# Patient Record
Sex: Female | Born: 1980 | Race: White | Hispanic: No | Marital: Married | Smoking: Never smoker
Health system: Southern US, Community
[De-identification: ages and names within clinical notes are randomized; demographics above are authoritative.]

## PROBLEM LIST (undated history)

## (undated) ENCOUNTER — Inpatient Hospital Stay (HOSPITAL_COMMUNITY): Payer: Self-pay

## (undated) DIAGNOSIS — F419 Anxiety disorder, unspecified: Secondary | ICD-10-CM

## (undated) DIAGNOSIS — B379 Candidiasis, unspecified: Secondary | ICD-10-CM

## (undated) DIAGNOSIS — R112 Nausea with vomiting, unspecified: Secondary | ICD-10-CM

## (undated) DIAGNOSIS — E079 Disorder of thyroid, unspecified: Secondary | ICD-10-CM

## (undated) DIAGNOSIS — R011 Cardiac murmur, unspecified: Secondary | ICD-10-CM

## (undated) DIAGNOSIS — R519 Headache, unspecified: Secondary | ICD-10-CM

## (undated) DIAGNOSIS — Z8619 Personal history of other infectious and parasitic diseases: Secondary | ICD-10-CM

## (undated) DIAGNOSIS — B019 Varicella without complication: Secondary | ICD-10-CM

## (undated) HISTORY — DX: Anxiety disorder, unspecified: F41.9

## (undated) HISTORY — PX: ABLATION: SHX5711

## (undated) HISTORY — DX: Candidiasis, unspecified: B37.9

## (undated) HISTORY — DX: Personal history of other infectious and parasitic diseases: Z86.19

## (undated) HISTORY — DX: Cardiac murmur, unspecified: R01.1

## (undated) HISTORY — DX: Disorder of thyroid, unspecified: E07.9

## (undated) HISTORY — PX: TONSILLECTOMY: SUR1361

## (undated) HISTORY — DX: Varicella without complication: B01.9

---

## 2006-02-04 ENCOUNTER — Encounter: Admission: RE | Admit: 2006-02-04 | Discharge: 2006-02-04 | Payer: Self-pay | Admitting: *Deleted

## 2006-02-23 ENCOUNTER — Encounter: Admission: RE | Admit: 2006-02-23 | Discharge: 2006-02-23 | Payer: Self-pay | Admitting: *Deleted

## 2006-03-03 ENCOUNTER — Encounter: Admission: RE | Admit: 2006-03-03 | Discharge: 2006-03-03 | Payer: Self-pay | Admitting: *Deleted

## 2006-12-01 ENCOUNTER — Other Ambulatory Visit: Admission: RE | Admit: 2006-12-01 | Discharge: 2006-12-01 | Payer: Self-pay | Admitting: Family Medicine

## 2007-09-21 IMAGING — US US SOFT TISSUE HEAD/NECK
1 series · 14 of 25 positions shown · non-contrast
Comparison: CT 02/04/2006

CLINICAL DATA: Right thyroid nodule seen on CT scan. Evaluate for possible
biopsy.

THYROID ULTRASOUND
TECHNIQUE: Ultrasound examination of the thyroid gland and adjacent soft tissue
structures was performed.

[Series 1: unknown · 0.07mm/px · 14 of 47 slices shown]
[im 1/47]
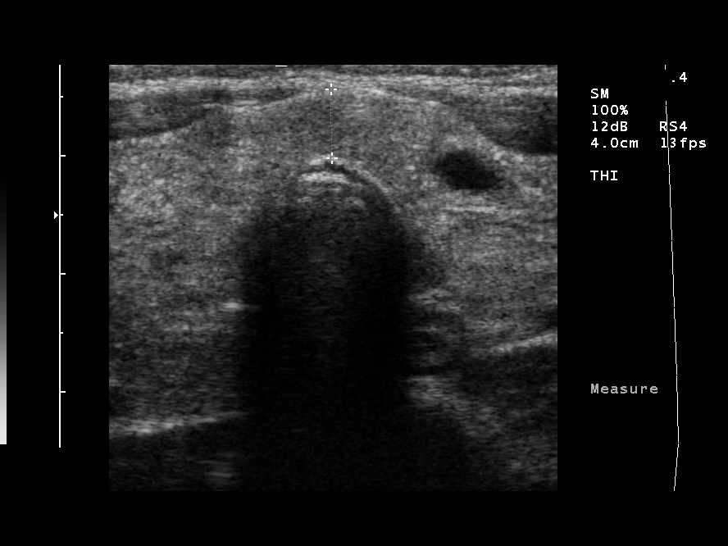
[im 4/47]
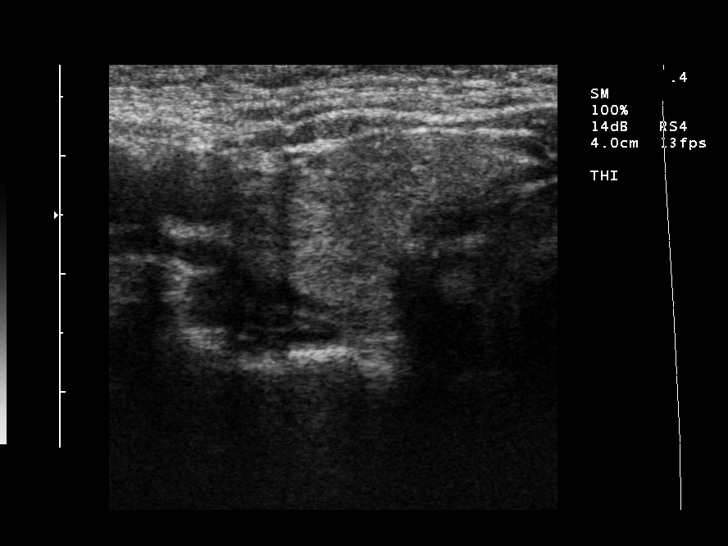
[im 8/47]
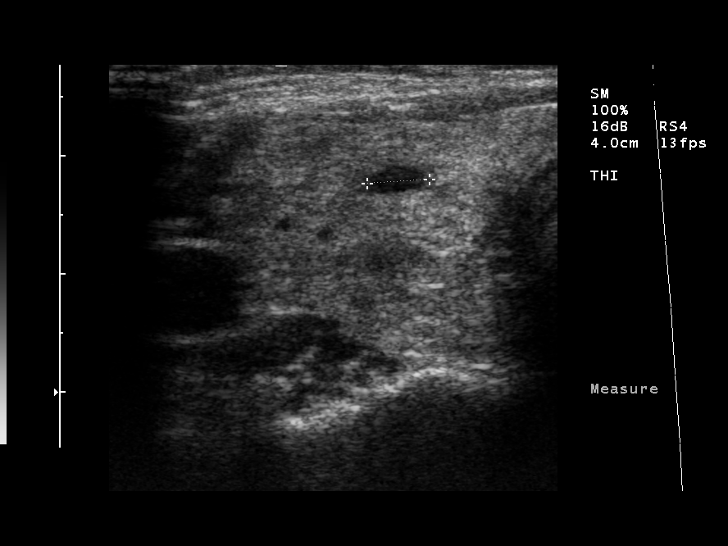
[im 12/47]
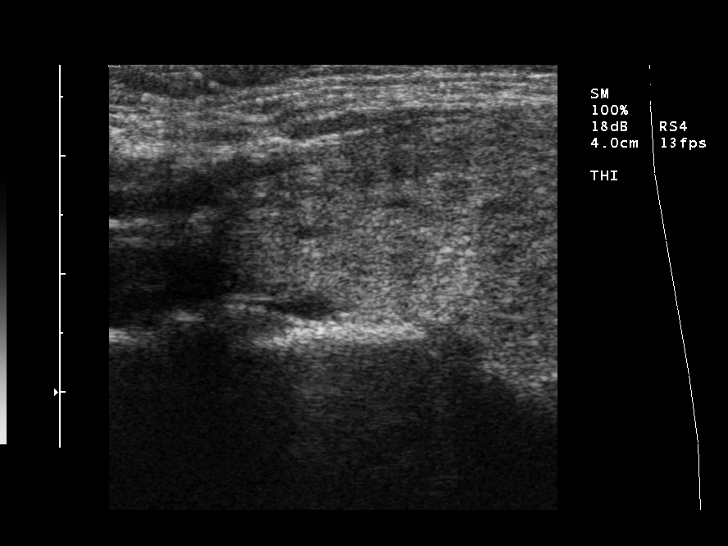
[im 16/47]
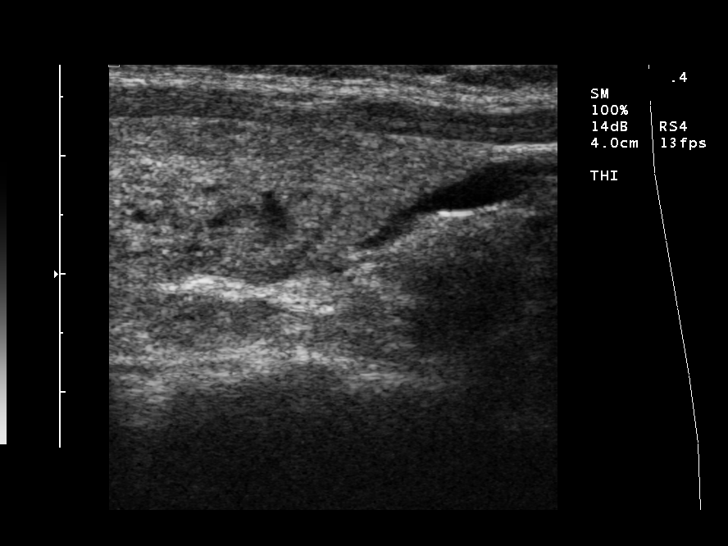
[im 18/47]
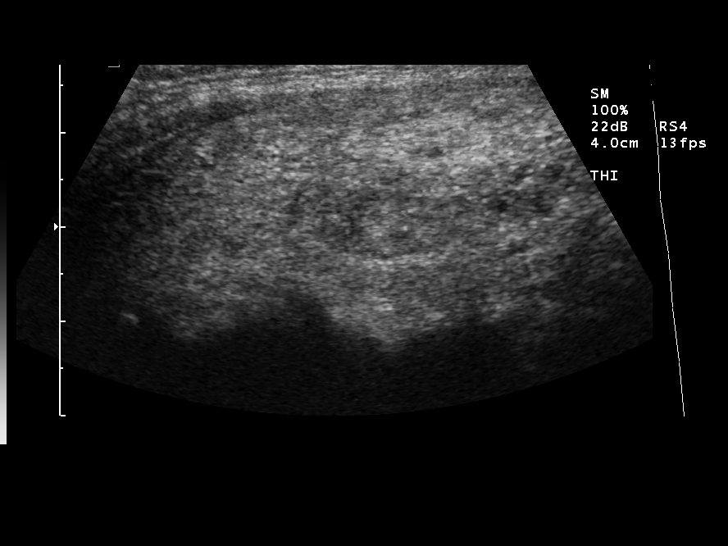
[im 22/47]
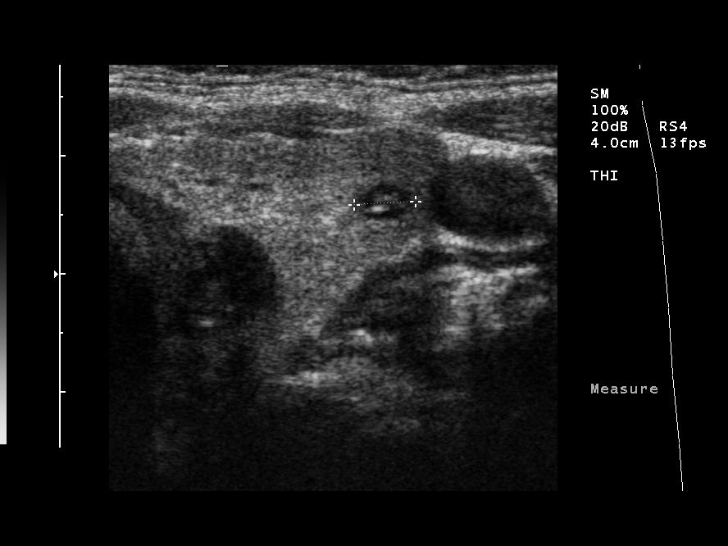
[im 25/47]
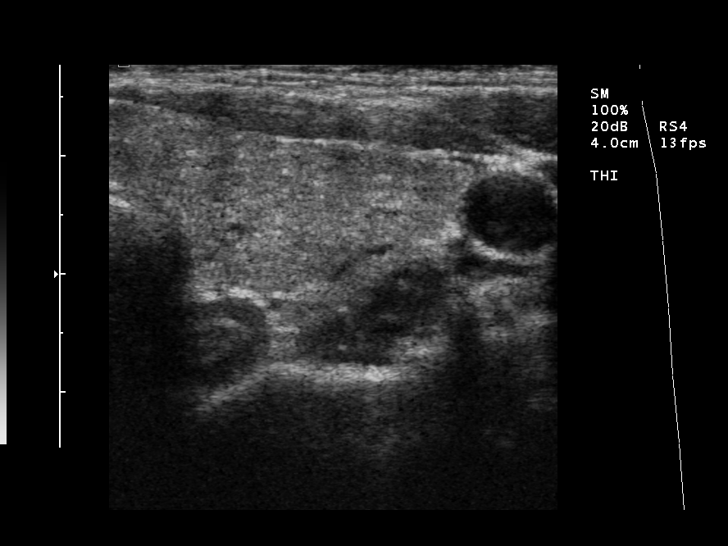
[im 29/47]
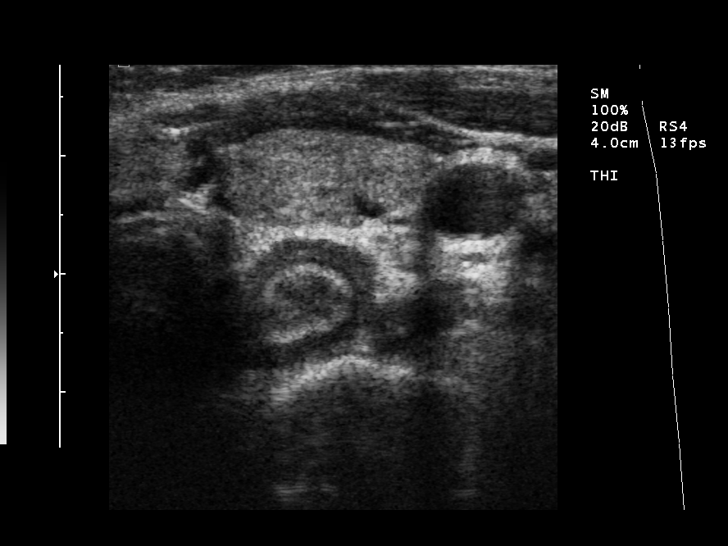
[im 31/47]
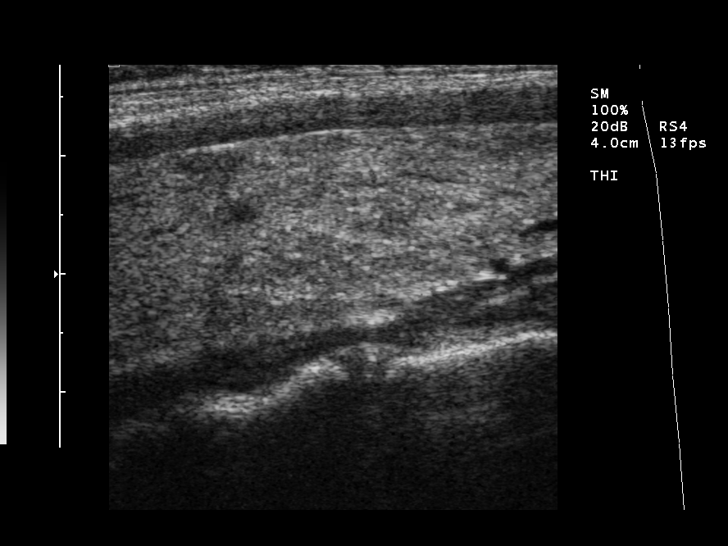
[im 35/47]
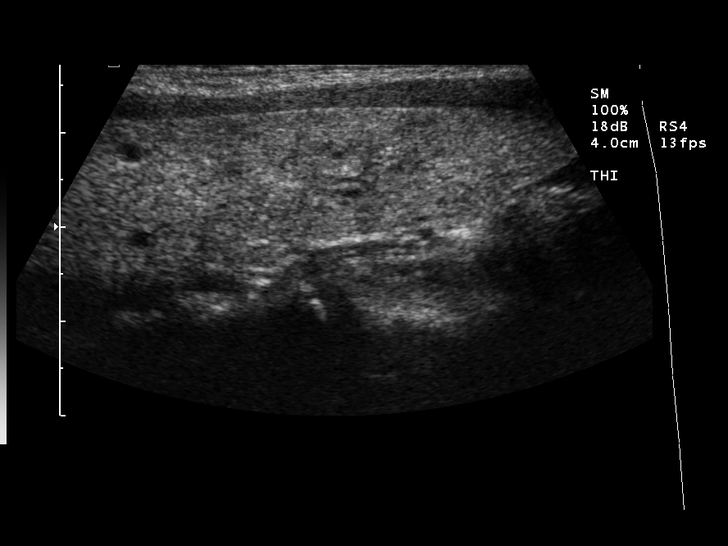
[im 39/47]
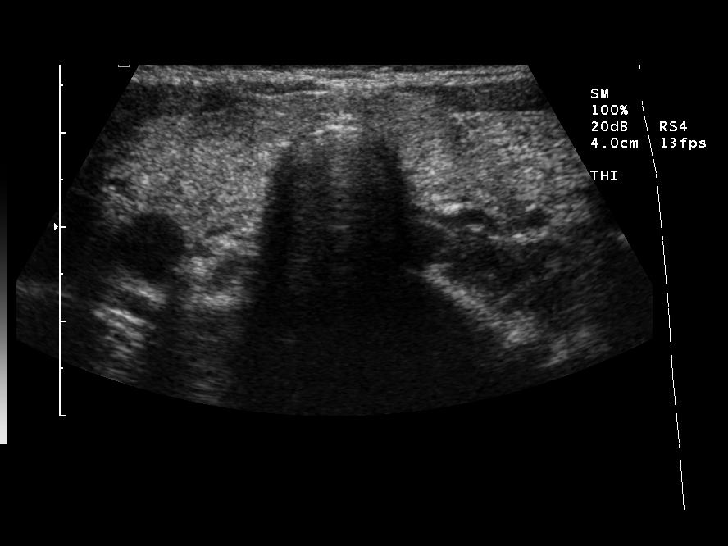
[im 43/47]
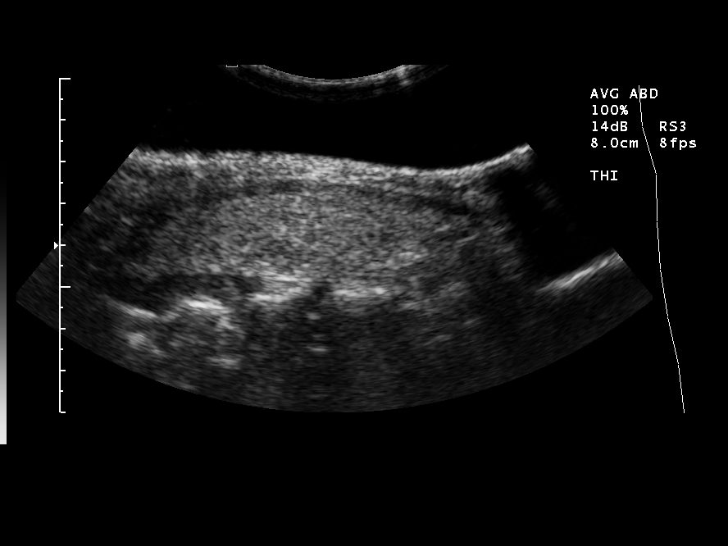
[im 47/47]
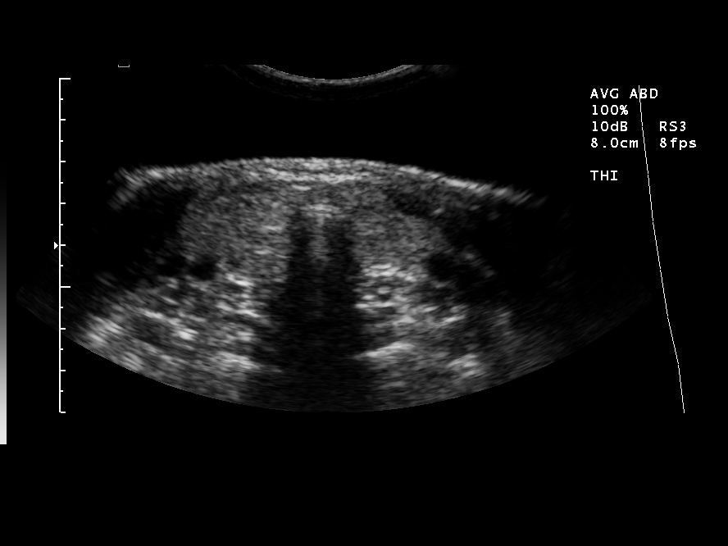

[14 of 25 positions shown; findings below may reference images not displayed]

FINDINGS: The right lobe of the thyroid measures 7.9 x 2.1 x 2.6 cm. Left
thyroid lobe measures 7.8 x 1.9 x 2.5 cm. Isthmus is 5-6 mm.

Thyroid echotexture is an homogeneous. There are several small hypoechoic
nodules seen within the thyroid lobes bilaterally. On the right, these are less
than 9 mm, and on the left, these are less than 8 mm.

Posteriorly in the right thyroid lobe, in the area questioned by CT scan, there
is no exophytic nodule seen. No focal lesions seen posteriorly in the right
thyroid lobe. No nodule over 1 cm seen at this time that warrants biopsy.

IMPRESSION

There is no ultrasound abnormality posteriorly within the right thyroid lobe
corresponding to the suspected nodule seen by CT scan.

There are several small hypoechoic nodules bilaterally, all less than 1 cm in
size, with no dominant nodule. Therefore no thyroid biopsy was performed.

Recommend followup ultrasound in 6-12 months.

## 2007-12-09 ENCOUNTER — Other Ambulatory Visit: Admission: RE | Admit: 2007-12-09 | Discharge: 2007-12-09 | Payer: Self-pay | Admitting: Family Medicine

## 2008-03-17 HISTORY — PX: WISDOM TOOTH EXTRACTION: SHX21

## 2008-12-27 ENCOUNTER — Other Ambulatory Visit: Admission: RE | Admit: 2008-12-27 | Discharge: 2008-12-27 | Payer: Self-pay | Admitting: Family Medicine

## 2010-01-25 ENCOUNTER — Other Ambulatory Visit: Admission: RE | Admit: 2010-01-25 | Discharge: 2010-01-25 | Payer: Self-pay | Admitting: Family Medicine

## 2011-03-18 NOTE — L&D Delivery Note (Signed)
Delivery Note At 11:34 AM a viable female, "Kaitlyn Todd", was delivered via Vaginal, Spontaneous Delivery (Presentation: Left Occiput Anterior).  APGAR: 9, 9; weight .   Placenta status: Intact, Spontaneous.  Cord: 3 vessels with the following complications: None.  Cord pH: NA Had 2 gushes of moderate bleeding just after placenta delivered--uterus slightly boggy. Cytotech 1000 mcg placed in rectum for prophylaxis.  Fundus firm after that. Vaginal vault friable, but hemostatic with pressure.  Anesthesia: Epidural  Episiotomy: None Lacerations: 2nd degree;Perineal Suture Repair: 3.0 and 4-0 monocryl Est. Blood Loss (mL): 400 cc   Mom to postpartum.  Baby to skin to skin.   Nigel Bridgeman 12/12/2011, 12:33 PM

## 2011-04-28 DIAGNOSIS — Z91040 Latex allergy status: Secondary | ICD-10-CM | POA: Insufficient documentation

## 2011-04-28 DIAGNOSIS — E049 Nontoxic goiter, unspecified: Secondary | ICD-10-CM | POA: Insufficient documentation

## 2011-04-28 DIAGNOSIS — F419 Anxiety disorder, unspecified: Secondary | ICD-10-CM | POA: Insufficient documentation

## 2011-04-28 LAB — OB RESULTS CONSOLE PLATELET COUNT: Platelets: 318 10*3/uL

## 2011-04-28 LAB — OB RESULTS CONSOLE ANTIBODY SCREEN: Antibody Screen: NEGATIVE

## 2011-04-28 LAB — OB RESULTS CONSOLE RPR: RPR: NONREACTIVE

## 2011-04-28 LAB — OB RESULTS CONSOLE ABO/RH: RH Type: POSITIVE

## 2011-04-28 LAB — OB RESULTS CONSOLE HGB/HCT, BLOOD
HCT: 40 %
Hemoglobin: 13.7 g/dL

## 2011-04-28 LAB — OB RESULTS CONSOLE TSH: TSH: 0.275

## 2011-04-28 LAB — OB RESULTS CONSOLE RUBELLA ANTIBODY, IGM: Rubella: IMMUNE

## 2011-04-28 LAB — OB RESULTS CONSOLE HEPATITIS B SURFACE ANTIGEN: Hepatitis B Surface Ag: NEGATIVE

## 2011-04-28 LAB — OB RESULTS CONSOLE HIV ANTIBODY (ROUTINE TESTING): HIV: NONREACTIVE

## 2011-05-13 ENCOUNTER — Encounter (INDEPENDENT_AMBULATORY_CARE_PROVIDER_SITE_OTHER): Payer: BC Managed Care – PPO

## 2011-05-13 ENCOUNTER — Encounter (INDEPENDENT_AMBULATORY_CARE_PROVIDER_SITE_OTHER): Payer: BC Managed Care – PPO | Admitting: Obstetrics and Gynecology

## 2011-05-13 DIAGNOSIS — N898 Other specified noninflammatory disorders of vagina: Secondary | ICD-10-CM

## 2011-05-13 DIAGNOSIS — Z01419 Encounter for gynecological examination (general) (routine) without abnormal findings: Secondary | ICD-10-CM

## 2011-05-13 DIAGNOSIS — O26849 Uterine size-date discrepancy, unspecified trimester: Secondary | ICD-10-CM

## 2011-05-13 DIAGNOSIS — Z331 Pregnant state, incidental: Secondary | ICD-10-CM

## 2011-05-13 LAB — OB RESULTS CONSOLE GC/CHLAMYDIA
Chlamydia: NEGATIVE
Gonorrhea: NEGATIVE

## 2011-06-16 ENCOUNTER — Encounter (INDEPENDENT_AMBULATORY_CARE_PROVIDER_SITE_OTHER): Payer: BC Managed Care – PPO | Admitting: Registered Nurse

## 2011-06-16 DIAGNOSIS — Z3480 Encounter for supervision of other normal pregnancy, unspecified trimester: Secondary | ICD-10-CM

## 2011-07-07 ENCOUNTER — Other Ambulatory Visit: Payer: Self-pay

## 2011-07-07 DIAGNOSIS — Z3689 Encounter for other specified antenatal screening: Secondary | ICD-10-CM

## 2011-07-08 DIAGNOSIS — Z91040 Latex allergy status: Secondary | ICD-10-CM

## 2011-07-08 DIAGNOSIS — F419 Anxiety disorder, unspecified: Secondary | ICD-10-CM

## 2011-07-08 DIAGNOSIS — E049 Nontoxic goiter, unspecified: Secondary | ICD-10-CM

## 2011-07-09 ENCOUNTER — Other Ambulatory Visit: Payer: BC Managed Care – PPO

## 2011-07-09 ENCOUNTER — Ambulatory Visit (INDEPENDENT_AMBULATORY_CARE_PROVIDER_SITE_OTHER): Payer: BC Managed Care – PPO

## 2011-07-09 ENCOUNTER — Encounter: Payer: Self-pay | Admitting: Obstetrics and Gynecology

## 2011-07-09 ENCOUNTER — Other Ambulatory Visit: Payer: Self-pay | Admitting: Obstetrics and Gynecology

## 2011-07-09 ENCOUNTER — Ambulatory Visit (INDEPENDENT_AMBULATORY_CARE_PROVIDER_SITE_OTHER): Payer: BC Managed Care – PPO | Admitting: Obstetrics and Gynecology

## 2011-07-09 VITALS — BP 120/62 | Ht 66.0 in | Wt 153.0 lb

## 2011-07-09 DIAGNOSIS — Z3689 Encounter for other specified antenatal screening: Secondary | ICD-10-CM

## 2011-07-09 DIAGNOSIS — Z3400 Encounter for supervision of normal first pregnancy, unspecified trimester: Secondary | ICD-10-CM

## 2011-07-09 LAB — US OB COMP + 14 WK

## 2011-07-09 NOTE — Progress Notes (Signed)
No complaints Doing Well Saw Dr Sharl Ma 3/1 had blood work on 4/1 Will get note U/s Reviewed Nl but limited anatomy will sched f/u at [facility] Ultrasound shows:  SIUP  S=D     [place] EDD: c/w dates            AFI: normal           Cervical length: not done           Placenta localization: anterior           Fetal presentation: breech                    Anatomy survey is normal limited           Gender : DNWTK

## 2011-08-06 ENCOUNTER — Encounter: Payer: Self-pay | Admitting: Obstetrics and Gynecology

## 2011-08-06 ENCOUNTER — Ambulatory Visit (INDEPENDENT_AMBULATORY_CARE_PROVIDER_SITE_OTHER): Payer: BC Managed Care – PPO | Admitting: Obstetrics and Gynecology

## 2011-08-06 ENCOUNTER — Ambulatory Visit (INDEPENDENT_AMBULATORY_CARE_PROVIDER_SITE_OTHER): Payer: BC Managed Care – PPO

## 2011-08-06 VITALS — BP 104/66 | Wt 160.0 lb

## 2011-08-06 DIAGNOSIS — Z3400 Encounter for supervision of normal first pregnancy, unspecified trimester: Secondary | ICD-10-CM

## 2011-08-06 DIAGNOSIS — Z331 Pregnant state, incidental: Secondary | ICD-10-CM

## 2011-08-06 DIAGNOSIS — O358XX0 Maternal care for other (suspected) fetal abnormality and damage, not applicable or unspecified: Secondary | ICD-10-CM

## 2011-08-06 LAB — US OB FOLLOW UP

## 2011-08-06 NOTE — Progress Notes (Addendum)
Patient ID: Kaitlyn Todd, female   DOB: 11-21-1980, 31 y.o.   MRN: [id number] anterior placenta US anatomy complete WNL cervix 3.3 Plan f/o 4 weeks 1 gtt, rpr, hgb discussed, Reviewed s/s preterm labor, srom, vag bleeding, kick counts to report, enc 8 water daily and frequent voids Lavera Guise, CNM

## 2011-09-03 ENCOUNTER — Other Ambulatory Visit: Payer: BC Managed Care – PPO

## 2011-09-03 ENCOUNTER — Ambulatory Visit (INDEPENDENT_AMBULATORY_CARE_PROVIDER_SITE_OTHER): Payer: BC Managed Care – PPO | Admitting: Obstetrics and Gynecology

## 2011-09-03 ENCOUNTER — Encounter: Payer: Self-pay | Admitting: Obstetrics and Gynecology

## 2011-09-03 VITALS — BP 106/62 | Wt 165.0 lb

## 2011-09-03 DIAGNOSIS — Z331 Pregnant state, incidental: Secondary | ICD-10-CM

## 2011-09-03 NOTE — Progress Notes (Signed)
Patient ID: Kaitlyn Todd, female   DOB: Sep 27, 1980, 31 y.o.   MRN: [id number] Reviewed s/s preterm labor, srom, vag bleeding, kick counts to report, enc 8 water daily and frequent . Some wheezing in pool last week, no hx of asthma just allergies no issues since f/o with PCP in this regard. Lavera Guise, CNM

## 2011-09-03 NOTE — Progress Notes (Signed)
Pt states she has no concerns today. 1 GTT given today.

## 2011-09-04 LAB — HEMOGLOBIN: Hemoglobin: 12.8 g/dL (ref 12.0–15.0)

## 2011-09-04 LAB — GLUCOSE TOLERANCE, 1 HOUR: Glucose, 1 Hour GTT: 110 mg/dL (ref 70–140)

## 2011-09-04 LAB — RPR

## 2011-09-15 ENCOUNTER — Ambulatory Visit (INDEPENDENT_AMBULATORY_CARE_PROVIDER_SITE_OTHER): Payer: BC Managed Care – PPO | Admitting: Obstetrics and Gynecology

## 2011-09-15 VITALS — BP 112/68 | Wt 168.0 lb

## 2011-09-15 DIAGNOSIS — Z331 Pregnant state, incidental: Secondary | ICD-10-CM

## 2011-09-15 NOTE — Progress Notes (Signed)
Glucola normal Diet reviewed

## 2011-09-29 ENCOUNTER — Encounter: Payer: Self-pay | Admitting: Obstetrics and Gynecology

## 2011-09-29 ENCOUNTER — Ambulatory Visit (INDEPENDENT_AMBULATORY_CARE_PROVIDER_SITE_OTHER): Payer: BC Managed Care – PPO | Admitting: Obstetrics and Gynecology

## 2011-09-29 VITALS — BP 110/66 | Wt 166.5 lb

## 2011-09-29 DIAGNOSIS — Z3490 Encounter for supervision of normal pregnancy, unspecified, unspecified trimester: Secondary | ICD-10-CM

## 2011-09-29 DIAGNOSIS — Z331 Pregnant state, incidental: Secondary | ICD-10-CM

## 2011-09-29 NOTE — Progress Notes (Signed)
Results for orders placed in visit on 09/03/11  GLUCOSE TOLERANCE, 1 HOUR      Component Value Range   Glucose, 1 Hour GTT 110  70 - 140 mg/dL  HEMOGLOBIN      Component Value Range   Hemoglobin 12.8  12.0 - 15.0 g/dL  RPR      Component Value Range   RPR NON REAC  NON REAC   No complaints FKCs Doing well RTO 2wks H/o goiter - will try to get note from Dr. Sharl Ma from visit in March and if not recently tested would rec TSH and free T4 at [facility]

## 2011-10-14 ENCOUNTER — Ambulatory Visit (INDEPENDENT_AMBULATORY_CARE_PROVIDER_SITE_OTHER): Payer: BC Managed Care – PPO | Admitting: Obstetrics and Gynecology

## 2011-10-14 ENCOUNTER — Encounter: Payer: Self-pay | Admitting: Obstetrics and Gynecology

## 2011-10-14 VITALS — BP 100/60 | Wt 173.0 lb

## 2011-10-14 DIAGNOSIS — Z331 Pregnant state, incidental: Secondary | ICD-10-CM

## 2011-10-14 NOTE — Progress Notes (Signed)
Pt states bp was 90/40 on Sunday and was experiencing dizziness.

## 2011-10-14 NOTE — Progress Notes (Signed)
Chest: Clear.  Heart: Regular rate and rhythm.  Ears: Clear. Dizziness discussed. Return office in 2 weeks. Dr. Stefano Gaul

## 2011-10-28 ENCOUNTER — Ambulatory Visit (INDEPENDENT_AMBULATORY_CARE_PROVIDER_SITE_OTHER): Payer: BC Managed Care – PPO | Admitting: Obstetrics and Gynecology

## 2011-10-28 VITALS — BP 110/72 | Wt 174.0 lb

## 2011-10-28 DIAGNOSIS — Z331 Pregnant state, incidental: Secondary | ICD-10-CM

## 2011-10-28 NOTE — Progress Notes (Signed)
Pt. Stated no issues today.  

## 2011-10-28 NOTE — Progress Notes (Signed)
Doing well.  Dizziness has improved.  Some trouble sleeping. Baby is very active. Form completed for work. Return to office in 2 weeks. Dr. Stefano Gaul

## 2011-11-10 ENCOUNTER — Encounter: Payer: Self-pay | Admitting: Obstetrics and Gynecology

## 2011-11-10 ENCOUNTER — Ambulatory Visit (INDEPENDENT_AMBULATORY_CARE_PROVIDER_SITE_OTHER): Payer: BC Managed Care – PPO | Admitting: Obstetrics and Gynecology

## 2011-11-10 VITALS — BP 120/60 | Wt 179.0 lb

## 2011-11-10 DIAGNOSIS — Z331 Pregnant state, incidental: Secondary | ICD-10-CM

## 2011-11-10 LAB — OB RESULTS CONSOLE GBS: GBS: NEGATIVE

## 2011-11-10 NOTE — Progress Notes (Signed)
Pt states no concerns today.   

## 2011-11-10 NOTE — Progress Notes (Signed)
GBS today

## 2011-11-10 NOTE — Progress Notes (Signed)
[redacted]w[redacted]d GBS today Labor signs reviewed

## 2011-11-13 LAB — CULTURE, BETA STREP (GROUP B ONLY)

## 2011-11-18 ENCOUNTER — Ambulatory Visit (INDEPENDENT_AMBULATORY_CARE_PROVIDER_SITE_OTHER): Payer: BC Managed Care – PPO | Admitting: Obstetrics and Gynecology

## 2011-11-18 ENCOUNTER — Encounter: Payer: Self-pay | Admitting: Obstetrics and Gynecology

## 2011-11-18 VITALS — BP 102/60 | Wt 179.0 lb

## 2011-11-18 DIAGNOSIS — Z331 Pregnant state, incidental: Secondary | ICD-10-CM

## 2011-11-18 NOTE — Progress Notes (Signed)
[redacted]w[redacted]d GBS negative Sleep issues: suggest Unisom

## 2011-11-18 NOTE — Progress Notes (Signed)
[redacted]w[redacted]d  Pt c/o sharp pelvic pains.

## 2011-11-25 ENCOUNTER — Ambulatory Visit (INDEPENDENT_AMBULATORY_CARE_PROVIDER_SITE_OTHER): Payer: BC Managed Care – PPO | Admitting: Obstetrics and Gynecology

## 2011-11-25 ENCOUNTER — Encounter: Payer: Self-pay | Admitting: Obstetrics and Gynecology

## 2011-11-25 VITALS — BP 102/70 | Wt 178.5 lb

## 2011-11-25 DIAGNOSIS — Z331 Pregnant state, incidental: Secondary | ICD-10-CM

## 2011-11-25 DIAGNOSIS — Z3490 Encounter for supervision of normal pregnancy, unspecified, unspecified trimester: Secondary | ICD-10-CM

## 2011-11-25 NOTE — Progress Notes (Signed)
No concerns per pt  Pt questioned is it best to get flu shot before or after delivery? Informed pt before delivery is recommended that way the baby is covered as well. Also informed pt flu shot not available in this office yet. Pt states she will get it at a flu vaccine fair.

## 2011-11-25 NOTE — Progress Notes (Signed)
Doing well, but ready. Note for work, informing patient may need additional days off prior to taking full maternity leave, due to pregnancy issues. Reviewed negative GBS. Attended [employer] preparation classes--hopes for unmedicated birth, but flexible.

## 2011-11-26 ENCOUNTER — Encounter: Payer: Self-pay | Admitting: Obstetrics and Gynecology

## 2011-11-26 ENCOUNTER — Other Ambulatory Visit: Payer: Self-pay | Admitting: Obstetrics and Gynecology

## 2011-11-27 ENCOUNTER — Inpatient Hospital Stay (HOSPITAL_COMMUNITY)
Admission: AD | Admit: 2011-11-27 | Discharge: 2011-11-27 | Disposition: A | Discharge status: Home / Self Care | Payer: BC Managed Care – PPO | Source: Ambulatory Visit | Attending: Obstetrics and Gynecology | Admitting: Obstetrics and Gynecology

## 2011-11-27 ENCOUNTER — Encounter (HOSPITAL_COMMUNITY): Payer: Self-pay | Admitting: *Deleted

## 2011-11-27 DIAGNOSIS — O368190 Decreased fetal movements, unspecified trimester, not applicable or unspecified: Secondary | ICD-10-CM | POA: Insufficient documentation

## 2011-11-27 NOTE — MAU Provider Note (Signed)
  History   C/o decreased Fetal movement this morning at [redacted]w[redacted]d Advised to come to the hospital for NST  CSN: [id number]  Arrival date and time: 11/27/11 4098   First Provider Initiated Contact with Patient 11/27/11 1191      Chief Complaint  Patient presents with  . Decreased Fetal Movement   HPI  OB History    Grav Para Term Preterm Abortions TAB SAB Ect Mult Living   1               Past Medical History  Diagnosis Date  . Varicella   . H/O varicella   . H/O candidiasis   . Heart murmur   . Anxiety   . Thyroid dysfunction   . Yeast infection     Past Surgical History  Procedure Date  . Tonsillectomy     age 31  . Wisdom tooth extraction 2010    Family History  Problem Relation Age of Onset  . Hypertension Mother   . Breast cancer Mother   . Anxiety disorder Mother   . Depression Father   . Breast cancer Maternal Aunt   . Depression Maternal Uncle   . Alzheimer's disease Maternal Grandmother   . Heart disease Maternal Grandfather   . Cancer Paternal Grandmother   . Parkinsonism Paternal Grandfather     History  Substance Use Topics  . Smoking status: Never Smoker   . Smokeless tobacco: Never Used  . Alcohol Use: No    Allergies:  Allergies  Allergen Reactions  . Latex Other (See Comments)    sensitivity    Prescriptions prior to admission  Medication Sig Dispense Refill  . Prenatal Vit-Fe Fumarate-FA (PRENATAL MULTIVITAMIN) TABS Take 1 tablet by mouth daily.        Review of Systems  Constitutional: Negative.   HENT: Negative.   Eyes: Negative.   Respiratory: Negative.   Cardiovascular: Negative.   Gastrointestinal: Negative.   Genitourinary: Negative.   Musculoskeletal: Negative.   Skin: Negative.   Neurological: Negative.   Endo/Heme/Allergies: Negative.   Psychiatric/Behavioral: Negative.    Physical Exam   Blood pressure 115/74, pulse 84, temperature 98 F (36.7 C), temperature source Oral, resp. rate 18, height 5\' 7"  (1.702  m), weight 177 lb 9.6 oz (80.559 kg), last menstrual period 03/04/2011.  Physical Exam  Constitutional: She is oriented to person, place, and time. She appears well-developed and well-nourished.  HENT:  Head: Normocephalic and atraumatic.  Eyes: Pupils are equal, round, and reactive to light.  Neck: Normal range of motion. Neck supple.  Cardiovascular: Normal rate, regular rhythm and normal heart sounds.   Respiratory: Effort normal and breath sounds normal.  GI: Soft. Bowel sounds are normal.  Genitourinary: Vagina normal.       SvE: 0.5/90%/-2, soft and favorable. Regular uterine contractions 1: 3 - 4 mins  Musculoskeletal: Normal range of motion.  Neurological: She is alert and oriented to person, place, and time. She has normal reflexes.  Skin: Skin is warm and dry.  Psychiatric: She has a normal mood and affect.    MAU Course  Procedures  NST: Accels and FM++ , Cat 1,  SVE  Assessment and Plan  Fetal assessment reassuring. Patient had SVE: 0.5/90%/-2, intact. Labor precautions given.   Sopheap Boehle, CNM. 11/27/2011, 9:11 AM

## 2011-11-27 NOTE — MAU Note (Signed)
Pt noticed decreased FM since 0630 this a.m.,  Denies uc's, bleeding or LOF.

## 2011-11-27 NOTE — MAU Note (Signed)
Patient states she has not been feeling as much fetal movement as usual this morning. Denies any contractions.

## 2011-12-02 ENCOUNTER — Ambulatory Visit (INDEPENDENT_AMBULATORY_CARE_PROVIDER_SITE_OTHER): Payer: BC Managed Care – PPO | Admitting: Obstetrics and Gynecology

## 2011-12-02 VITALS — BP 118/62 | Wt 180.0 lb

## 2011-12-02 DIAGNOSIS — Z331 Pregnant state, incidental: Secondary | ICD-10-CM

## 2011-12-02 NOTE — Patient Instructions (Signed)

## 2011-12-02 NOTE — Progress Notes (Signed)
[redacted]w[redacted]d Doing well, GFM Was seen at MAU for dec FM, NST reactive Pt was upset Sunday that baby hadn't come yet, feels better now Took bradley classes Enc relaxation and stretching Has pelvic pressure but not many ctx cx 1cm/50/-2, anterior, soft, but not stretchy  rv'd PD mgmnt rv'd FKC and labor sx's RTO 1 wk

## 2011-12-02 NOTE — Progress Notes (Signed)
[redacted]w[redacted]d  Pt desires cervix check today.

## 2011-12-08 ENCOUNTER — Ambulatory Visit (INDEPENDENT_AMBULATORY_CARE_PROVIDER_SITE_OTHER): Payer: BC Managed Care – PPO | Admitting: Obstetrics and Gynecology

## 2011-12-08 ENCOUNTER — Telehealth: Payer: Self-pay | Admitting: Obstetrics and Gynecology

## 2011-12-08 VITALS — BP 110/60 | Wt 183.0 lb

## 2011-12-08 DIAGNOSIS — Z0371 Encounter for suspected problem with amniotic cavity and membrane ruled out: Secondary | ICD-10-CM

## 2011-12-08 DIAGNOSIS — Z331 Pregnant state, incidental: Secondary | ICD-10-CM

## 2011-12-08 NOTE — Patient Instructions (Signed)

## 2011-12-08 NOTE — Progress Notes (Signed)
[redacted]w[redacted]d Pt presents today with watery leakage on last pm and twice this am. Pt had previously complained of decrease fetal movement but stated that the baby is moving more now after she had a meal. Mathis Bud

## 2011-12-08 NOTE — Telephone Encounter (Signed)
TC from pt.   States has had 3 small gushes of fluid this AM. Underwear are damp. No contractions. Unsure if decreased FM.  Per SL pt to office for eval.

## 2011-12-08 NOTE — Progress Notes (Signed)
[redacted]w[redacted]d Had sm amt leaking last night and several more times this morning, saturates underwear, but not pad GFM Rare ctx Sterile spec - neg pooling, neg fern, neg nitrazine Cervical mucous noted Cx=loose 1/50/-2  Ready!  rv'd labor sx's and FKC RTO 1 wk, will do NST rv'd PD mgmnt , pt would like to avoid IOL, but may be open to it

## 2011-12-10 ENCOUNTER — Encounter: Payer: BC Managed Care – PPO | Admitting: Obstetrics and Gynecology

## 2011-12-11 ENCOUNTER — Encounter (HOSPITAL_COMMUNITY): Payer: Self-pay | Admitting: *Deleted

## 2011-12-11 ENCOUNTER — Inpatient Hospital Stay (HOSPITAL_COMMUNITY)
Admission: AD | Admit: 2011-12-11 | Discharge: 2011-12-14 | DRG: 373 | Disposition: A | Discharge status: Home / Self Care | Payer: BC Managed Care – PPO | Source: Ambulatory Visit | Attending: Obstetrics and Gynecology | Admitting: Obstetrics and Gynecology

## 2011-12-11 ENCOUNTER — Telehealth: Payer: Self-pay | Admitting: Obstetrics and Gynecology

## 2011-12-11 DIAGNOSIS — IMO0001 Reserved for inherently not codable concepts without codable children: Secondary | ICD-10-CM

## 2011-12-11 LAB — CBC
HCT: 44.1 % (ref 36.0–46.0)
Hemoglobin: 14.7 g/dL (ref 12.0–15.0)
MCH: 29.2 pg (ref 26.0–34.0)
MCHC: 33.3 g/dL (ref 30.0–36.0)
MCV: 87.7 fL (ref 78.0–100.0)
Platelets: 181 10*3/uL (ref 150–400)
RBC: 5.03 MIL/uL (ref 3.87–5.11)
RDW: 14 % (ref 11.5–15.5)
WBC: 10.2 10*3/uL (ref 4.0–10.5)

## 2011-12-11 LAB — RPR: RPR Ser Ql: NONREACTIVE

## 2011-12-11 MED ORDER — CITRIC ACID-SODIUM CITRATE 334-500 MG/5ML PO SOLN: 30.0000 mL | ORAL | Status: DC | PRN

## 2011-12-11 MED ORDER — LACTATED RINGERS IV SOLN
500.0000 mL | INTRAVENOUS | Status: DC | PRN
  Administered 2011-12-12: 500 mL via INTRAVENOUS

## 2011-12-11 MED ORDER — LACTATED RINGERS IV SOLN
INTRAVENOUS | Status: DC
  Administered 2011-12-12: 06:00:00 via INTRAVENOUS

## 2011-12-11 MED ORDER — OXYTOCIN 40 UNITS IN LACTATED RINGERS INFUSION - SIMPLE MED
1.0000 m[IU]/min | INTRAVENOUS | Status: DC
  Administered 2011-12-11: 2 m[IU]/min via INTRAVENOUS
  Filled 2011-12-11: qty 1000

## 2011-12-11 MED ORDER — TERBUTALINE SULFATE 1 MG/ML IJ SOLN: 0.2500 mg | Freq: Once | INTRAMUSCULAR | Status: AC | PRN

## 2011-12-11 MED ORDER — ACETAMINOPHEN 325 MG PO TABS: 650.0000 mg | ORAL_TABLET | ORAL | Status: DC | PRN

## 2011-12-11 MED ORDER — ONDANSETRON HCL 4 MG/2ML IJ SOLN
4.0000 mg | Freq: Four times a day (QID) | INTRAMUSCULAR | Status: DC | PRN
  Administered 2011-12-12: 4 mg via INTRAVENOUS
  Filled 2011-12-11: qty 2

## 2011-12-11 MED ORDER — IBUPROFEN 600 MG PO TABS: 600.0000 mg | ORAL_TABLET | Freq: Four times a day (QID) | ORAL | Status: DC | PRN

## 2011-12-11 MED ORDER — FENTANYL CITRATE 0.05 MG/ML IJ SOLN
100.0000 ug | INTRAMUSCULAR | Status: DC | PRN
  Administered 2011-12-11: 100 ug via INTRAVENOUS
  Filled 2011-12-11: qty 2

## 2011-12-11 MED ORDER — OXYCODONE-ACETAMINOPHEN 5-325 MG PO TABS: 1.0000 | ORAL_TABLET | ORAL | Status: DC | PRN

## 2011-12-11 MED ORDER — FENTANYL CITRATE 0.05 MG/ML IJ SOLN
50.0000 ug | Freq: Once | INTRAMUSCULAR | Status: AC
  Administered 2011-12-11: 50 ug via INTRAVENOUS
  Filled 2011-12-11: qty 2

## 2011-12-11 MED ORDER — OXYTOCIN BOLUS FROM INFUSION
500.0000 mL | Freq: Once | INTRAVENOUS | Status: DC
  Filled 2011-12-11: qty 500

## 2011-12-11 MED ORDER — OXYTOCIN 40 UNITS IN LACTATED RINGERS INFUSION - SIMPLE MED
62.5000 mL/h | Freq: Once | INTRAVENOUS | Status: AC
  Administered 2011-12-12: 62.5 mL/h via INTRAVENOUS

## 2011-12-11 MED ORDER — LIDOCAINE HCL (PF) 1 % IJ SOLN
30.0000 mL | INTRAMUSCULAR | Status: DC | PRN
  Administered 2011-12-12: 30 mL via SUBCUTANEOUS
  Filled 2011-12-11: qty 30

## 2011-12-11 NOTE — Progress Notes (Signed)
Comfortable, and has returned to bed after walking for 30 mins O VSS      fhts category 1, baseline 140 bpm      abd soft between uc      Contractions: 1 : 4 mins      Vag  A: Active labor with minimal intervention P: Will evaluate  In 1 hrs for possible AROM  Earl Gala, CNM.

## 2011-12-11 NOTE — MAU Note (Signed)
Pt reports having ctx since last night now about 4-58min apart. Reports some clear to pink mucusy discharged and good fetal movement.

## 2011-12-11 NOTE — MAU Provider Note (Signed)
  History    [name] has had contraction since 3 am this morning. Describes them and strong lasting over a minute in duration.  Called the office this morning for advise if she should come to the office to get checked or should she be checked in MAU. Evaluation of labor.  CSN: [id number]  Arrival date and time: 12/11/11 1127   None     Chief Complaint  Patient presents with  . Labor Eval   HPI  OB History    Grav Para Term Preterm Abortions TAB SAB Ect Mult Living   1               Past Medical History  Diagnosis Date  . Varicella   . H/O varicella   . H/O candidiasis   . Heart murmur   . Anxiety   . Thyroid dysfunction   . Yeast infection     Past Surgical History  Procedure Date  . Tonsillectomy     age 22  . Wisdom tooth extraction 2010    Family History  Problem Relation Age of Onset  . Hypertension Mother   . Breast cancer Mother   . Anxiety disorder Mother   . Depression Father   . Breast cancer Maternal Aunt   . Depression Maternal Uncle   . Alzheimer's disease Maternal Grandmother   . Heart disease Maternal Grandfather   . Cancer Paternal Grandmother   . Parkinsonism Paternal Grandfather     History  Substance Use Topics  . Smoking status: Never Smoker   . Smokeless tobacco: Never Used  . Alcohol Use: No    Allergies:  Allergies  Allergen Reactions  . Latex Other (See Comments)    sensitivity    Prescriptions prior to admission  Medication Sig Dispense Refill  . Prenatal Vit-Fe Fumarate-FA (PRENATAL MULTIVITAMIN) TABS Take 1 tablet by mouth daily.        Review of Systems  Constitutional: Negative.   HENT: Negative.   Eyes: Negative.   Respiratory: Negative.   Cardiovascular: Negative.   Gastrointestinal: Positive for diarrhea.       During contractions mainly  Genitourinary: Negative.   Musculoskeletal: Negative.   Skin: Negative.   Neurological: Negative.   Endo/Heme/Allergies: Negative.   Psychiatric/Behavioral:  Negative.    Physical Exam   Blood pressure 123/83, pulse 92, temperature 98.1 F (36.7 C), temperature source Oral, resp. rate 16, height 5\' 6"  (1.676 m), weight 177 lb (80.287 kg), last menstrual period 03/04/2011.   Physical Exam  Constitutional: She appears well-developed and well-nourished.  Eyes: Conjunctivae normal and EOM are normal. Pupils are equal, round, and reactive to light.  Neck: Normal range of motion. Neck supple.  Cardiovascular: Normal rate and regular rhythm.   Respiratory: Effort normal and breath sounds normal.  GI: Soft. Bowel sounds are normal.  Genitourinary: Vagina normal.       SVE: 4-5 /- 2 / bulging bag of membranes.  Musculoskeletal: Normal range of motion.  Skin: Skin is warm and dry.  Psychiatric: She has a normal mood and affect.    MAU Course  Procedures The patient is in active labor and has expressed that she desires minimal intervention. Has requested minimal pain medication. Has decided that 1/2 normal dose would the dosage that she would tolerate best as drug naive.  Assessment and Plan  Active labor. No risk factors. Admit to [facility] for labor management.  Kaitlyn Todd  ,CNM. 12/11/2011, 1:19 PM

## 2011-12-11 NOTE — Telephone Encounter (Signed)
Pt is 40wk 2d called complaining of contractions every 4-6 min since 4 am.  [name] gave instructions to inform pt to go to MAU.  CNM DD was on call and informed.  Pt was not in distress. Kaitlyn Todd

## 2011-12-11 NOTE — H&P (Signed)
  Kaitlyn Todd is a 31 y.o. female, 1P0000 at [redacted]w[redacted]d , presenting for evaluation of labor. The patient has been contracting regularly through the night and the contraction were regular 1: 4 -5 mins. Slight mucoid, blood stained vaginal discharge on admission.  Patient Active Problem List  Diagnosis  . Goiter  . Anxiety  . Latex sensitivity    History of present pregnancy: Patient entered care at 14 weeks.  EDC of 2/24/13was established by 2nd trimester USS and c/w with LMP  Anatomy scan was done at 18 weeks, with normal findings and an posterior placenta.  Her prenatal course was essentially uncomplicated.  Further signficant events were episodes of transient dizziness which was idiopathic. Hx of Goiter and had thyroid panel which was in normal limits.  At her last evalution, she was today 09/11/11 at MAU prior to admission to [facility].  OB History    Grav Para Term Preterm Abortions TAB SAB Ect Mult Living   1              Past Medical History  Diagnosis Date  . Varicella   . H/O varicella   . H/O candidiasis   . Heart murmur   . Anxiety   . Thyroid dysfunction   . Yeast infection    Past Surgical History  Procedure Date  . Tonsillectomy     age 3  . Wisdom tooth extraction 2010   Family History: family history includes Alzheimer's disease in her maternal grandmother; Anxiety disorder in her mother; Breast cancer in her maternal aunt and mother; Cancer in her paternal grandmother; Depression in her father and maternal uncle; Heart disease in her maternal grandfather; Hypertension in her mother; and Parkinsonism in her paternal grandfather. Social History:  reports that she has never smoked. She has never used smokeless tobacco. She reports that she does not drink alcohol or use illicit drugs.  ROS:   Allergies  Allergen Reactions  . Latex Other (See Comments)    sensitivity       Blood pressure 123/83, pulse 92, temperature 98.1 F (36.7 C), temperature source  Oral, resp. rate 16, height 5\' 6"  (1.676 m), weight 177 lb (80.287 kg), last menstrual period 03/04/2011.  Chest clear Heart RRR without murmur Abd gravid, NT, FH  To dates Pelvic:adequate Ext: normal  FHR:145 bpm UCs:  4 - 5 mins  Prenatal labs: ABO, Rh: A/Positive/-- (02/11 0000) Antibody:  neg Rubella:   Immune RPR: NON REAC (06/19 1108)  HBsAg: Negative (02/11 0000)  HIV: Non-reactive (02/11 0000)  GBS: Negative (08/26 0000) Sickle cell/Hgb electrophoresis:  neg Pap: Normal GC:  neg Chlamydia: neg Genetic screenings: Normal Glucola:  Normal       Assessment/Plan: IUP at [redacted]w[redacted]d, active labor, GBS - neg  Desires minimal intervention for labor. Drug naive.   Plan: Admit to B/S for labor management.   Eilis Chestnutt, DENISECNM, [place] 12/11/2011, 1:40 PM

## 2011-12-11 NOTE — Progress Notes (Signed)
The patient is now resting in bed and coping well with contractions. O VSS BP 120/76  Pulse 82  Temp 98.3 F (36.8 C) (Axillary)  Resp 20  Ht 5\' 7"  (1.702 m)  Wt 177 lb (80.287 kg)  BMI 27.72 kg/m2  LMP 03/04/2011       Fhts category 1, baseline: 140 bpm      Abd soft between uc      Contractions: 1 : 4 - 5 mins prior to AROM       SVE: 5/90%/ 0 station. Arom to clear , copious amount of clear fluid. Vx descended to 0 station.                ROA position.  A Active labor with AROM to clear P: Expectant management for NSVD of viable female infant " Aiden"  Earl Gala, CNM.

## 2011-12-11 NOTE — Progress Notes (Signed)
Comfortable, with Fentanyl 50 mgs IV O VSS BP 131/83  Pulse 93  Temp 98.2 F (36.8 C) (Oral)  Resp 20  Ht 5\' 7"  (1.702 m)  Wt 177 lb (80.287 kg)  BMI 27.72 kg/m2  LMP 03/04/2011       Fhts category 1       abd soft between uc      Contractions 1 : 3 - 4 mins      Vag 4-5 cms, 80%/-2, bulging bag       When sedation of Fentanyl has been completed requested to bet out of bed using the birthing ball. A  Active labor,   P continue care, Conservative management.  Earl Gala, CNM.

## 2011-12-11 NOTE — Progress Notes (Signed)
Patient is coping well with CTX and breathing through CTX.  had been out on birthing ball and now desires to checked for progress in labor. O: VSS BP 143/81  Pulse 94  Temp 97.7 F (36.5 C) (Oral)  Resp 20  Ht 5\' 7"  (1.702 m)  Wt 177 lb (80.287 kg)  BMI 27.72 kg/m2  LMP 03/04/2011       Fhts category 1, baseline 140 bpm        abd soft between uc      Contractions 1 : 3 - 4 mins       SVE: 6/90%/ 0 - +1 A: AROM, now protracted labor curve. P: Augment with Pitocin to advance labor curve.     Patient is in agreement with plan. EFM continuous while on Pitocin.  Earl Gala, CNM.

## 2011-12-11 NOTE — MAU Note (Signed)
C/o ucs since 2000 yesterday afternoon;

## 2011-12-12 ENCOUNTER — Inpatient Hospital Stay (HOSPITAL_COMMUNITY): Payer: BC Managed Care – PPO | Admitting: Anesthesiology

## 2011-12-12 ENCOUNTER — Encounter (HOSPITAL_COMMUNITY): Payer: Self-pay | Admitting: Anesthesiology

## 2011-12-12 ENCOUNTER — Encounter (HOSPITAL_COMMUNITY): Payer: Self-pay | Admitting: Obstetrics and Gynecology

## 2011-12-12 MED ORDER — EPHEDRINE 5 MG/ML INJ: 10.0000 mg | INTRAVENOUS | Status: DC | PRN

## 2011-12-12 MED ORDER — ONDANSETRON HCL 4 MG/2ML IJ SOLN: 4.0000 mg | INTRAMUSCULAR | Status: DC | PRN

## 2011-12-12 MED ORDER — DIBUCAINE 1 % RE OINT: 1.0000 "application " | TOPICAL_OINTMENT | RECTAL | Status: DC | PRN

## 2011-12-12 MED ORDER — MISOPROSTOL 200 MCG PO TABS
ORAL_TABLET | ORAL | Status: AC
  Administered 2011-12-12: 1000 ug via RECTAL
  Filled 2011-12-12: qty 5

## 2011-12-12 MED ORDER — BENZOCAINE-MENTHOL 20-0.5 % EX AERO
1.0000 "application " | INHALATION_SPRAY | CUTANEOUS | Status: DC | PRN
  Filled 2011-12-12: qty 56

## 2011-12-12 MED ORDER — PHENYLEPHRINE 40 MCG/ML (10ML) SYRINGE FOR IV PUSH (FOR BLOOD PRESSURE SUPPORT)
80.0000 ug | PREFILLED_SYRINGE | INTRAVENOUS | Status: DC | PRN
  Filled 2011-12-12: qty 5

## 2011-12-12 MED ORDER — EPHEDRINE 5 MG/ML INJ
10.0000 mg | INTRAVENOUS | Status: DC | PRN
  Filled 2011-12-12: qty 4

## 2011-12-12 MED ORDER — OXYCODONE-ACETAMINOPHEN 5-325 MG PO TABS: 1.0000 | ORAL_TABLET | ORAL | Status: DC | PRN

## 2011-12-12 MED ORDER — FENTANYL 2.5 MCG/ML BUPIVACAINE 1/10 % EPIDURAL INFUSION (WH - ANES)
INTRAMUSCULAR | Status: DC | PRN
  Administered 2011-12-12: 12 mL/h via EPIDURAL

## 2011-12-12 MED ORDER — SIMETHICONE 80 MG PO CHEW: 80.0000 mg | CHEWABLE_TABLET | ORAL | Status: DC | PRN

## 2011-12-12 MED ORDER — WITCH HAZEL-GLYCERIN EX PADS: 1.0000 "application " | MEDICATED_PAD | CUTANEOUS | Status: DC | PRN

## 2011-12-12 MED ORDER — OXYTOCIN 40 UNITS IN LACTATED RINGERS INFUSION - SIMPLE MED: 1.0000 m[IU]/min | INTRAVENOUS | Status: DC

## 2011-12-12 MED ORDER — DIPHENHYDRAMINE HCL 25 MG PO CAPS: 25.0000 mg | ORAL_CAPSULE | Freq: Four times a day (QID) | ORAL | Status: DC | PRN

## 2011-12-12 MED ORDER — FENTANYL 2.5 MCG/ML BUPIVACAINE 1/10 % EPIDURAL INFUSION (WH - ANES)
14.0000 mL/h | INTRAMUSCULAR | Status: DC
  Administered 2011-12-12 (×2): 14 mL/h via EPIDURAL
  Filled 2011-12-12 (×3): qty 60

## 2011-12-12 MED ORDER — IBUPROFEN 600 MG PO TABS
600.0000 mg | ORAL_TABLET | Freq: Four times a day (QID) | ORAL | Status: DC
  Administered 2011-12-12 – 2011-12-14 (×8): 600 mg via ORAL
  Filled 2011-12-12 (×8): qty 1

## 2011-12-12 MED ORDER — LANOLIN HYDROUS EX OINT: TOPICAL_OINTMENT | CUTANEOUS | Status: DC | PRN

## 2011-12-12 MED ORDER — ONDANSETRON HCL 4 MG PO TABS: 4.0000 mg | ORAL_TABLET | ORAL | Status: DC | PRN

## 2011-12-12 MED ORDER — SENNOSIDES-DOCUSATE SODIUM 8.6-50 MG PO TABS
2.0000 | ORAL_TABLET | Freq: Every day | ORAL | Status: DC
  Administered 2011-12-12 – 2011-12-13 (×2): 2 via ORAL

## 2011-12-12 MED ORDER — PHENYLEPHRINE 40 MCG/ML (10ML) SYRINGE FOR IV PUSH (FOR BLOOD PRESSURE SUPPORT): 80.0000 ug | PREFILLED_SYRINGE | INTRAVENOUS | Status: DC | PRN

## 2011-12-12 MED ORDER — TERBUTALINE SULFATE 1 MG/ML IJ SOLN: 0.2500 mg | Freq: Once | INTRAMUSCULAR | Status: DC | PRN

## 2011-12-12 MED ORDER — TETANUS-DIPHTH-ACELL PERTUSSIS 5-2.5-18.5 LF-MCG/0.5 IM SUSP: 0.5000 mL | Freq: Once | INTRAMUSCULAR | Status: DC

## 2011-12-12 MED ORDER — LIDOCAINE HCL (PF) 1 % IJ SOLN
INTRAMUSCULAR | Status: DC | PRN
  Administered 2011-12-12 (×2): 9 mL

## 2011-12-12 MED ORDER — PRENATAL MULTIVITAMIN CH
1.0000 | ORAL_TABLET | Freq: Every day | ORAL | Status: DC
  Administered 2011-12-13 – 2011-12-14 (×2): 1 via ORAL
  Filled 2011-12-12 (×2): qty 1

## 2011-12-12 MED ORDER — LACTATED RINGERS IV SOLN
500.0000 mL | Freq: Once | INTRAVENOUS | Status: AC
  Administered 2011-12-12: 1000 mL via INTRAVENOUS

## 2011-12-12 MED ORDER — LACTATED RINGERS IV SOLN
INTRAVENOUS | Status: DC
  Administered 2011-12-12: 05:00:00 via INTRAUTERINE

## 2011-12-12 MED ORDER — ZOLPIDEM TARTRATE 5 MG PO TABS: 5.0000 mg | ORAL_TABLET | Freq: Every evening | ORAL | Status: DC | PRN

## 2011-12-12 MED ORDER — DIPHENHYDRAMINE HCL 50 MG/ML IJ SOLN: 12.5000 mg | INTRAMUSCULAR | Status: DC | PRN

## 2011-12-12 NOTE — Progress Notes (Signed)
Patient ID: Kaitlyn Todd, female   DOB: October 21, 1980, 31 y.o.   MRN: [id number] .Subjective: Comfortable w epidural, just now some cramping on r side   Objective: BP 118/75  Pulse 74  Temp 97.5 F (36.4 C) (Axillary)  Resp 20  Ht 5\' 7"  (1.702 m)  Wt 177 lb (80.287 kg)  BMI 27.72 kg/m2  SpO2 98%  LMP 03/04/2011   FHT:  FHR: 130 bpm, variability: moderate,  accelerations:  Present,  decelerations:  Present late decels  UC:   regular, every 2-3 minutes SVE:   8/100/-1  IUPC placed without difficulty vtx is OT   Assessment / Plan: Protracted active phase, ctx not adequate  Will begin amnioinfusion D/c pitocin and restart in if FHR pattern improves  Utilize position changes to encourage fetal rotation and descent    Fetal Wellbeing:  Category II Pain Control:  Epidural  Update physician PRN  Malissa Hippo 12/12/2011, 4:27 AM

## 2011-12-12 NOTE — Anesthesia Postprocedure Evaluation (Signed)
  Anesthesia Post Note  Patient: Kaitlyn Todd  Procedure(s) Performed: * No procedures listed *  Anesthesia type: Epidural  Patient location: Mother/Baby  Post pain: Pain level controlled  Post assessment: Post-op Vital signs reviewed  Last Vitals:  Filed Vitals:   12/12/11 1525  BP: 100/65  Pulse: 92  Temp: 37.5 C  Resp: 20    Post vital signs: Reviewed  Level of consciousness:alert  Complications: No apparent anesthesia complications

## 2011-12-12 NOTE — Progress Notes (Signed)
Patient ID: Kaitlyn Todd, female   DOB: 1980-11-18, 31 y.o.   MRN: [id number] .Subjective: Resting in bed, breathing well w ctx, relaxed, requesting addition IV pain meds, husband at [facility]   Objective: BP 136/80  Pulse 80  Temp 97.7 F (36.5 C) (Oral)  Resp 20  Ht 5\' 7"  (1.702 m)  Wt 177 lb (80.287 kg)  BMI 27.72 kg/m2  LMP 03/04/2011   FHT:  FHR: 130 bpm, variability: moderate,  accelerations:  Present,  decelerations:  Present mild variables UC:   regular, every 2-3 minutes SVE:   Dilation: 7 Effacement (%): 90 Station: +1 Exam by:: c soliz rn  Pitocin at 2mu  Assessment / Plan: Augmentation of labor, progressing well GBS neg Will give fentanyl IV Continue support Anticipate SVD   Fetal Wellbeing:  Category I Pain Control:  Fentanyl  Update physician PRN  Malissa Hippo 12/12/2011, 1:07 AM

## 2011-12-12 NOTE — Anesthesia Procedure Notes (Signed)
Epidural Patient location during procedure: OB Start time: 12/12/2011 2:50 AM End time: 12/12/2011 2:54 AM  Staffing Anesthesiologist: Sandrea Hughs Performed by: anesthesiologist   Preanesthetic Checklist Completed: patient identified, site marked, surgical consent, pre-op evaluation, timeout performed, IV checked, risks and benefits discussed and monitors and equipment checked  Epidural Patient position: sitting Prep: site prepped and draped and DuraPrep Patient monitoring: continuous pulse ox and blood pressure Approach: midline Injection technique: LOR air  Needle:  Needle type: Tuohy  Needle gauge: 17 G Needle length: 9 cm and 9 Needle insertion depth: 7 cm Catheter type: closed end flexible Catheter size: 19 Gauge Catheter at skin depth: 12 cm Test dose: negative  Assessment Sensory level: T9 Events: blood not aspirated, injection not painful, no injection resistance, negative IV test and no paresthesia  Additional Notes Reason for block:procedure for pain

## 2011-12-12 NOTE — Progress Notes (Signed)
Patient ID: Kaitlyn Todd, female   DOB: [birth year], 31 y.o.   MRN: [id number] .Subjective: Feeling more pressure, rcv'd mod relief w fentanyl    Objective: BP 115/59  Pulse 71  Temp 98 F (36.7 C) (Oral)  Resp 20  Ht 5\' 7"  (1.702 m)  Wt 177 lb (80.287 kg)  BMI 27.72 kg/m2  LMP 03/04/2011   FHT:  FHR: 130 bpm, variability: moderate,  accelerations:  Present,  decelerations:  Present mild early variables UC:   regular, every 2-3 minutes SVE:   Dilation: 8 Effacement (%): 100 Station: +1 Exam by:: Akeem Heppler cnm  Pitocin at 2mu  Assessment / Plan: Augmentation of labor, progressing well Mild-mec stained fluid, will CTO, notify NICU prn Continue active mgmnt    Fetal Wellbeing:  Category II Pain Control:  Fentanyl  Update physician PRN  Malissa Hippo 12/12/2011, 2:09 AM

## 2011-12-12 NOTE — Progress Notes (Signed)
Patient ID: Kaitlyn Todd, female   DOB: 1980/12/29, 31 y.o.   MRN: [id number] .Subjective:  Remains comfortable w epidural, somewhat anxious, but has been resting  Objective: BP 123/71  Pulse 70  Temp 97.7 F (36.5 C) (Axillary)  Resp 20  Ht 5\' 7"  (1.702 m)  Wt 177 lb (80.287 kg)  BMI 27.72 kg/m2  SpO2 98%  LMP 03/04/2011   FHT:  FHR: 130 bpm, variability: moderate,  accelerations:  Present,  decelerations:  Present late decels improved, since last exam, but then have recently returned  UC:   regular, every 2-4 minutes SVE:   Dilation: Lip/rim Effacement (%): 100 Station: +1 Exam by:: c soliz rn  Pitocin at 2mu MVU's 110    Assessment / Plan: cervical change despite inadequate ctx Position changes Foley cath placed Fluid is clearing to light mec, will continue amnioinfusion    Fetal Wellbeing:  Category I and Category II Pain Control:  Epidural  Update physician PRN  Malissa Hippo 12/12/2011, 6:43 AM

## 2011-12-12 NOTE — Anesthesia Preprocedure Evaluation (Signed)
Anesthesia Evaluation  Patient identified by MRN, date of birth, ID band Patient awake    Reviewed: Allergy & Precautions, H&P , NPO status , Patient's Chart, lab work & pertinent test results  Airway Mallampati: II TM Distance: >3 FB Neck ROM: full    Dental No notable dental hx.    Pulmonary neg pulmonary ROS,  breath sounds clear to auscultation  Pulmonary exam normal       Cardiovascular negative cardio ROS      Neuro/Psych negative neurological ROS     GI/Hepatic negative GI ROS, Neg liver ROS,   Endo/Other  negative endocrine ROS  Renal/GU negative Renal ROS  negative genitourinary   Musculoskeletal negative musculoskeletal ROS (+)   Abdominal Normal abdominal exam  (+)   Peds negative pediatric ROS (+)  Hematology negative hematology ROS (+)   Anesthesia Other Findings   Reproductive/Obstetrics (+) Pregnancy                           Anesthesia Physical Anesthesia Plan  ASA: II  Anesthesia Plan: Epidural   Post-op Pain Management:    Induction:   Airway Management Planned:   Additional Equipment:   Intra-op Plan:   Post-operative Plan:   Informed Consent: I have reviewed the patients History and Physical, chart, labs and discussed the procedure including the risks, benefits and alternatives for the proposed anesthesia with the patient or authorized representative who has indicated his/her understanding and acceptance.     Plan Discussed with:   Anesthesia Plan Comments:         Anesthesia Quick Evaluation  

## 2011-12-13 ENCOUNTER — Encounter (HOSPITAL_COMMUNITY): Payer: Self-pay | Admitting: *Deleted

## 2011-12-13 LAB — CBC
HCT: 30.9 % — ABNORMAL LOW (ref 36.0–46.0)
Hemoglobin: 10.7 g/dL — ABNORMAL LOW (ref 12.0–15.0)
MCH: 30.1 pg (ref 26.0–34.0)
MCHC: 34.6 g/dL (ref 30.0–36.0)
MCV: 86.8 fL (ref 78.0–100.0)
Platelets: 163 K/uL (ref 150–400)
RBC: 3.56 MIL/uL — ABNORMAL LOW (ref 3.87–5.11)
RDW: 13.8 % (ref 11.5–15.5)
WBC: 19 K/uL — ABNORMAL HIGH (ref 4.0–10.5)

## 2011-12-13 NOTE — Progress Notes (Signed)
Post Partum Day 1:S/P SVB Subjective: Doing well, up ad lib without syncope or dizziness. Using sitz bath for comfort. Feeding:  Breast Contraceptive plan:   Likely Micronor  Objective: Blood pressure 96/55, pulse 86, temperature 98.1 F (36.7 C), temperature source Oral, resp. rate 20, height 5\' 7"  (1.702 m), weight 177 lb (80.287 kg), last menstrual period 03/04/2011, SpO2 98.00%, unknown if currently breastfeeding.  Physical Exam:  General: alert Lochia: appropriate Uterine Fundus: firm Incision: healing well DVT Evaluation: No evidence of DVT seen on physical exam. Negative Homan's sign.   Basename 12/13/11 0500 12/11/11 1410  HGB 10.7* 14.7  HCT 30.9* 44.1    Assessment/Plan: Stable pp day 1 Anticipate d/c tomorrow. Plan Rx Micronor.   LOS: 2 days   Tekela Garguilo 12/13/2011, 10:20 AM

## 2011-12-14 MED ORDER — IBUPROFEN 600 MG PO TABS: 600.0000 mg | ORAL_TABLET | Freq: Four times a day (QID) | ORAL | Status: DC | PRN

## 2011-12-14 MED ORDER — NORETHINDRONE 0.35 MG PO TABS: 1.0000 | ORAL_TABLET | Freq: Every day | ORAL | Status: DC

## 2011-12-14 NOTE — Discharge Summary (Signed)
Obstetric Discharge Summary Reason for Admission: onset of labor Prenatal Procedures: NST and ultrasound Intrapartum Procedures: spontaneous vaginal delivery Postpartum Procedures: none Complications-Operative and Postpartum: 2nd degree perineal laceration Hemoglobin  Date Value Range Status  12/13/2011 10.7* 12.0 - 15.0 g/dL Final     DELTA CHECK NOTED     REPEATED TO VERIFY  04/28/2011 13.7   Final     HCT  Date Value Range Status  12/13/2011 30.9* 36.0 - 46.0 % Final  04/28/2011 40   Final   Hospital Course:  Admitted in labor on 12/11/11.  Negative GBS. Progressed with pitocin. Utilized epidural for pain management.  Delivery was performed by Nigel Bridgeman, CNM, without complication. Patient and baby tolerated the procedure without difficulty, with  2nd laceration noted. Infant status was stable and remained in room with mother.  Mother and infant then had an uncomplicated postpartum course, with breast feeding going well. Mom's physical exam was WNL, and she was discharged home in stable condition. Contraception plan was Micronor.  She received adequate benefit from po pain medications, with use of Motrin.        Physical Exam:  General: alert Lochia: appropriate Uterine Fundus: firm Incision: healing well DVT Evaluation: No evidence of DVT seen on physical exam. Negative Homan's sign.  Discharge Diagnoses: Term Pregnancy-delivered  Discharge Information: Date: 12/14/2011 Activity: Per [facility] handout Diet: routine Medications: Ibuprofen Condition: stable Instructions: refer to practice specific booklet Discharge to: home Follow-up Information    Follow up with [facility]. Schedule an appointment as soon as possible for a visit in 6 weeks. (Call for any questions or concern.)    Contact information:   [address] [place] [address] [phone]         Newborn Data: Live born female  Birth Weight: 9  lb 10.5 oz (4380 g) APGAR: 9, 9  Home with mother.  Nigel Bridgeman 12/14/2011, 10:32 AM

## 2011-12-15 ENCOUNTER — Other Ambulatory Visit: Payer: BC Managed Care – PPO

## 2011-12-15 ENCOUNTER — Encounter: Payer: BC Managed Care – PPO | Admitting: Obstetrics and Gynecology

## 2011-12-17 NOTE — Progress Notes (Signed)
Post discharge chart review completed.  

## 2011-12-22 ENCOUNTER — Telehealth: Payer: Self-pay

## 2011-12-22 ENCOUNTER — Ambulatory Visit (HOSPITAL_COMMUNITY)
Admission: RE | Admit: 2011-12-22 | Discharge: 2011-12-22 | Disposition: A | Discharge status: Home / Self Care | Payer: BC Managed Care – PPO | Source: Ambulatory Visit | Attending: Obstetrics and Gynecology | Admitting: Obstetrics and Gynecology

## 2011-12-22 NOTE — Telephone Encounter (Signed)
CALL IN RX TO [facility] FOR DR. Butch Penny NIPPLE CREAM PER [facility] LACTATION DEPT PER [facility].

## 2011-12-22 NOTE — Progress Notes (Addendum)
Adult Lactation Consultation Outpatient Visit Note  Patient Name: Kaitlyn Todd Date of Birth: [birth year] Gestational Age at Delivery: [redacted]w[redacted]d Type of Delivery: vaginal del  Breastfeeding History: Frequency of Breastfeeding: every 2-2.5 hours Length of Feeding: 15-30 mins Voids: 8-10 Stools: 8 yellow seedy  Supplementing / Method: none  Pumping:  Type of Pump:Medela Pump N Style   Frequency: pumped x last 24 hours due to sore nipples  Volume:  3 ounces each session  Comments:  Mother phoned to schedule out patient visit due to extremely sore, cracked, and bleeding nipples. She has been using a #24 nipple shield on (L) breast (inverted). Mother has good erect nipple on (R) breast. Mother has positional stripes bilaterally. Observed swollen montgomery gland or plugged milk pore on (R) areola at 2 o'clock. Mother states this area is extremely painful.    Consultation Evaluation:  observed infant feeding on (L) breast using #24 nipple shield in football hold. Infant has difficulty getting the depth he needs. #20 NS was used and latch did not improve. Several attempts were made to relax jaw for wider gape. Some improvement with new #24 . Infant transferred 28 ml. Mother taught cross cradle hold and infant was placed at (R) breast without nipple shield. Mother complained of pain due to swollen gland so infant was placed in football hold. Infant had much better latch with wider gape. Infant transferred another 18 ml for a total feeding of 48ml.  Observation of tight posterior frenulum. Infant has limited lateral movement of tongue.  Infant extends his tongue to gum ridge only.    Initial Feeding Assessment: Pre-feed Weight:4270, 9-6.6 Post-feed UJWJXB:1478, 9-7.6 Amount Transferred:44ml Comments:  Additional Feeding Assessment: Pre-feed GNFAOZ:3086 Post-feed VHQION:6295 Amount Transferred:57ml Comments:  Additional Feeding Assessment: Pre-feed Weight: Post-feed Weight: Amount  Transferred: Comments:  Total Breast milk Transferred this Visit: 48 ml Total Supplement Given:   Additional Interventions: Encouraged mother to massage swollen area on (R) breast with fingers behind pore, using olive oil. Mother was given comfort gels to use until she gets her all purpose nipple cream. Reviewed proper application of nipple shield. Mother encouraged to use good breast support. inst in better positions and rotate positions frequently. Mother phoned Dr Estanislado Pandy for all purpose nipple cream . Recommend to follow up in one week.  Follow-Up  October 14 at 10:30     Stevan Born [facility] 12/22/2011, 9:52 AM

## 2011-12-22 NOTE — Telephone Encounter (Signed)
Pt had a visit with [facility] Lactation Specialist due to cracked and bleeding nipples. Pt states they recommend a rx'd nipple cream. Pt wants it called to [facility] [phone] ASAP.

## 2011-12-29 ENCOUNTER — Ambulatory Visit (HOSPITAL_COMMUNITY)
Admission: RE | Admit: 2011-12-29 | Discharge: 2011-12-29 | Disposition: A | Discharge status: Home / Self Care | Payer: BC Managed Care – PPO | Source: Ambulatory Visit | Attending: Obstetrics and Gynecology | Admitting: Obstetrics and Gynecology

## 2011-12-29 NOTE — Progress Notes (Signed)
Adult Lactation Consultation Outpatient Visit Note  Patient Name: Kaitlyn Todd Date of Birth: 02/14/1981 Gestational Age at Delivery: Unknown Type of Delivery:   Breastfeeding History: Frequency of Breastfeeding: q 2 hours during the day, Q3-4 at night Length of Feeding: 15-30 min Voids: lots Stools: lots  Supplementing / Method:none Pumping:  Type of Pump:   Frequency:  Volume:    Comments:    Consultation Evaluation:  Initial Feeding Assessment: Pre-feed Weight: 9-8.4  4320g Post-feed Weight: 9-10  4368  Amount Transferred: 48 cc's Comments: Baby was very fussy when they came in. Calmed baby, then latched to left breast with NS. Did not try to latch without NS since he was so fussy. Some non nutritive sucking noted as baby relaxed. Nursed for about 20 minutes. Mom's breast did soften somewhat. She reports that usually Aiden feeds better than this.  Additional Feeding Assessment: Pre-feed Weight: 9- 10  4368g Post-feed Weight: 9- 10.9  4392 Amount Transferred:24 cc's Comments: After being on the scale, Aiden woke up more and nursed for about 10 minutes, then off to sleep. Breast softer and more swallows noted. Mom reports that both nipples are much improved since last week. Asking about pumping to prepare for going back to work- questions answered. No further questions at this time,  Additional Feeding Assessment: Pre-feed Weight: Post-feed Weight: Amount Transferred: Comments:  Total Breast milk Transferred this Visit: 72 Total Supplement Given: 0  Additional Interventions:  Encouraged mom to try to get Aiden to nurse on both breasts at each feeding to get a little more in. She is only doing one breast at a time.  Follow-Up Suggested [facility] for weight check and support next Tuesday. To see Ped on 10/30 To call here prn    Pamelia Hoit 12/29/2011, 11:49 AM

## 2012-01-01 ENCOUNTER — Telehealth: Payer: Self-pay | Admitting: Obstetrics and Gynecology

## 2012-01-02 ENCOUNTER — Encounter: Payer: BC Managed Care – PPO | Admitting: Obstetrics and Gynecology

## 2012-01-02 ENCOUNTER — Encounter: Payer: Self-pay | Admitting: Obstetrics and Gynecology

## 2012-01-02 ENCOUNTER — Ambulatory Visit (INDEPENDENT_AMBULATORY_CARE_PROVIDER_SITE_OTHER): Payer: BC Managed Care – PPO | Admitting: Obstetrics and Gynecology

## 2012-01-02 VITALS — BP 110/70 | Ht 66.0 in | Wt 157.0 lb

## 2012-01-02 DIAGNOSIS — K6289 Other specified diseases of anus and rectum: Secondary | ICD-10-CM

## 2012-01-02 NOTE — Progress Notes (Signed)
Here secondary to pain near her rectum.  Filed Vitals:   01/02/12 1416  BP: 110/70   ROS: noncontributory  Pelvic exam:  VULVA: normal appearing vulva with no masses, tenderness or lesions,  VAGINA: normal appearing vagina with normal color and discharge, no lesions, tenderness at site of repair at introitus but healing well CERVIX: normal appearing cervix without discharge or lesions,  UTERUS: uterus is normal size, shape, consistency and nontender,  ADNEXA: normal adnexa in size, nontender and no masses.  A/P rec stool softeners Trial of prep H Keep pp visit Nl exam will probably just take time to resolve.  Pt had 9lb 13oz baby and only a 2nd degree lac.

## 2012-01-22 ENCOUNTER — Ambulatory Visit (INDEPENDENT_AMBULATORY_CARE_PROVIDER_SITE_OTHER): Payer: BC Managed Care – PPO | Admitting: Obstetrics and Gynecology

## 2012-01-22 ENCOUNTER — Encounter: Payer: Self-pay | Admitting: Obstetrics and Gynecology

## 2012-01-22 VITALS — BP 120/80 | Temp 98.5°F | Ht 66.0 in | Wt 154.0 lb

## 2012-01-22 DIAGNOSIS — N898 Other specified noninflammatory disorders of vagina: Secondary | ICD-10-CM

## 2012-01-22 DIAGNOSIS — N760 Acute vaginitis: Secondary | ICD-10-CM

## 2012-01-22 DIAGNOSIS — A499 Bacterial infection, unspecified: Secondary | ICD-10-CM

## 2012-01-22 DIAGNOSIS — B9689 Other specified bacterial agents as the cause of diseases classified elsewhere: Secondary | ICD-10-CM

## 2012-01-22 LAB — POCT WET PREP (WET MOUNT)
Bacteria Wet Prep HPF POC: NEGATIVE
Clue Cells Wet Prep Whiff POC: NEGATIVE
Trichomonas Wet Prep HPF POC: NEGATIVE
WBC, Wet Prep HPF POC: NEGATIVE
pH: 5

## 2012-01-22 MED ORDER — METRONIDAZOLE 0.75 % VA GEL: 1.0000 | Freq: Every day | VAGINAL | Status: AC

## 2012-01-22 NOTE — Progress Notes (Deleted)
DATE OF SURGERY: *** TYPE OF SURGERY:*** PAIN:{EXAM; YES/NO:19492::"No"} VAG BLEEDING: {yes no:314532} VAG DISCHARGE: {yes no:314532} NORMAL GI FUNCTN: {yes no:314532} NORMAL GU FUNCTN: {yes ZO:109604}

## 2012-01-22 NOTE — Progress Notes (Signed)
Date of delivery: 12/12/2011 Female Name: Kaitlyn Todd Vaginal delivery:yes Cesarean section:no Tubal ligation:no GDM:no Breast Feeding:yes Bottle Feeding:yes/breast milk Post-Partum Blues:no Abnormal pap:no Normal GU function: yes Normal GI function:yes Returning to work:no EPDS: Score--4

## 2012-01-22 NOTE — Progress Notes (Signed)
Kaitlyn Todd is a 31 y.o. female who presents for a postpartum visit.   Type of delivery:  SVB, 9+15 weight, 2nd degree, by VL  Patient reports doing well.  Has d/c with odor. No other issues.  Hx remarkable for: Patient Active Problem List  Diagnosis  . Goiter  . Anxiety  . Latex sensitivity  . Vaginal delivery  . Perineal laceration with delivery, second degree      PPDS = 4  Contraception plan:  Condoms   I have fully reviewed the prenatal and intrapartum course   Patient has not been sexually active since delivery.   The following portions of the patient's history were reviewed and updated as appropriate: allergies, current medications, past family history, past medical history, past social history, past surgical history and problem list.  Review of Systems Pertinent items are noted in HPI.   Objective:    Breastfeeding? Unknown  General:  alert, cooperative and no distress     Lungs: clear to auscultation bilaterally  Heart:  regular rate and rhythm, S1, S2 normal, no murmur  Abdomen: soft, non-tender; bowel sounds normal; no masses,  no organomegaly.   Incision:  NA   Vulva:  normal  Vagina: normal vagina, well-healed 2nd degree laceration, thin white d/c in vault  Cervix:  normal  Uterus: normal size, contour, position, consistency, mobility, non-tender, well-involuted  Adnexa:  normal adnexa          Wet prep--BV   Assessment:     Normal postpartum exam.  BV Pap smear:   not done at today's visit.   Due 04/2012  Plan:  Follow-up at annual in 04/2012 Rxs:  Metrogel vaginal gel qhs x 5 days.  Nigel Bridgeman CNM, [place] 01/22/2012 8:13 AM

## 2012-02-09 ENCOUNTER — Telehealth: Payer: Self-pay | Admitting: Obstetrics and Gynecology

## 2012-02-09 NOTE — Telephone Encounter (Signed)
TC to pt to advise that she can take Plain sudafed, Mucinex, Robitussin, and Deslym Pt voiced understanding Danylle Ouk, Herbert Seta

## 2012-06-09 ENCOUNTER — Other Ambulatory Visit: Payer: Self-pay | Admitting: Obstetrics and Gynecology

## 2012-06-10 LAB — PAP IG W/ RFLX HPV ASCU

## 2012-06-11 ENCOUNTER — Other Ambulatory Visit: Payer: Self-pay | Admitting: Obstetrics and Gynecology

## 2012-06-11 DIAGNOSIS — Z803 Family history of malignant neoplasm of breast: Secondary | ICD-10-CM

## 2012-06-11 DIAGNOSIS — Z1231 Encounter for screening mammogram for malignant neoplasm of breast: Secondary | ICD-10-CM

## 2012-06-14 ENCOUNTER — Other Ambulatory Visit: Payer: Self-pay | Admitting: Obstetrics and Gynecology

## 2012-06-14 LAB — LIPID PANEL
Cholesterol: 136 mg/dL (ref 0–200)
HDL: 73 mg/dL (ref >39)
LDL Cholesterol: 57 mg/dL (ref 0–99)
Total CHOL/HDL Ratio: 1.9 Ratio
Triglycerides: 32 mg/dL (ref <150)
VLDL: 6 mg/dL (ref 0–40)

## 2012-06-25 ENCOUNTER — Ambulatory Visit: Payer: BC Managed Care – PPO

## 2012-06-29 ENCOUNTER — Other Ambulatory Visit: Payer: Self-pay | Admitting: Obstetrics and Gynecology

## 2013-09-21 ENCOUNTER — Other Ambulatory Visit: Payer: Self-pay | Admitting: Obstetrics and Gynecology

## 2013-09-21 DIAGNOSIS — Z1231 Encounter for screening mammogram for malignant neoplasm of breast: Secondary | ICD-10-CM

## 2013-09-27 ENCOUNTER — Ambulatory Visit
Admission: RE | Admit: 2013-09-27 | Discharge: 2013-09-27 | Disposition: A | Discharge status: Home / Self Care | Payer: BC Managed Care – PPO | Source: Ambulatory Visit | Attending: Obstetrics and Gynecology | Admitting: Obstetrics and Gynecology

## 2013-09-27 DIAGNOSIS — Z1231 Encounter for screening mammogram for malignant neoplasm of breast: Secondary | ICD-10-CM

## 2014-01-16 ENCOUNTER — Encounter: Payer: Self-pay | Admitting: Obstetrics and Gynecology

## 2014-04-19 ENCOUNTER — Ambulatory Visit
Admission: RE | Admit: 2014-04-19 | Discharge: 2014-04-19 | Disposition: A | Discharge status: Home / Self Care | Payer: BC Managed Care – PPO | Source: Ambulatory Visit | Attending: Internal Medicine | Admitting: Internal Medicine

## 2014-04-19 ENCOUNTER — Other Ambulatory Visit: Payer: Self-pay | Admitting: Internal Medicine

## 2014-04-19 DIAGNOSIS — R5383 Other fatigue: Secondary | ICD-10-CM

## 2014-04-19 DIAGNOSIS — J40 Bronchitis, not specified as acute or chronic: Secondary | ICD-10-CM

## 2014-04-19 DIAGNOSIS — J9801 Acute bronchospasm: Secondary | ICD-10-CM

## 2014-08-30 LAB — OB RESULTS CONSOLE RUBELLA ANTIBODY, IGM: Rubella: IMMUNE

## 2014-08-30 LAB — OB RESULTS CONSOLE GC/CHLAMYDIA
Chlamydia: NEGATIVE
Gonorrhea: NEGATIVE

## 2014-08-30 LAB — OB RESULTS CONSOLE RPR: RPR: NONREACTIVE

## 2014-08-30 LAB — OB RESULTS CONSOLE ABO/RH: RH Type: POSITIVE

## 2014-08-30 LAB — OB RESULTS CONSOLE HEPATITIS B SURFACE ANTIGEN: Hepatitis B Surface Ag: NEGATIVE

## 2014-08-30 LAB — OB RESULTS CONSOLE ANTIBODY SCREEN: Antibody Screen: NEGATIVE

## 2014-08-30 LAB — OB RESULTS CONSOLE HIV ANTIBODY (ROUTINE TESTING): HIV: NONREACTIVE

## 2014-12-10 ENCOUNTER — Inpatient Hospital Stay (HOSPITAL_COMMUNITY)
Admission: AD | Admit: 2014-12-10 | Discharge: 2014-12-10 | Disposition: A | Discharge status: Home / Self Care | Payer: BC Managed Care – PPO | Source: Ambulatory Visit | Attending: Obstetrics & Gynecology | Admitting: Obstetrics & Gynecology

## 2014-12-10 ENCOUNTER — Encounter (HOSPITAL_COMMUNITY): Payer: Self-pay | Admitting: *Deleted

## 2014-12-10 DIAGNOSIS — Z3A24 24 weeks gestation of pregnancy: Secondary | ICD-10-CM | POA: Insufficient documentation

## 2014-12-10 DIAGNOSIS — R42 Dizziness and giddiness: Secondary | ICD-10-CM | POA: Diagnosis present

## 2014-12-10 DIAGNOSIS — O26892 Other specified pregnancy related conditions, second trimester: Secondary | ICD-10-CM | POA: Diagnosis not present

## 2014-12-10 LAB — URINALYSIS, ROUTINE W REFLEX MICROSCOPIC
Bilirubin Urine: NEGATIVE
Glucose, UA: NEGATIVE mg/dL
Ketones, ur: NEGATIVE mg/dL
Nitrite: NEGATIVE
Protein, ur: NEGATIVE mg/dL
Specific Gravity, Urine: <1.005 — ABNORMAL LOW (ref 1.005–1.030)
Urobilinogen, UA: 0.2 mg/dL (ref 0.0–1.0)
pH: 6 (ref 5.0–8.0)

## 2014-12-10 LAB — URINE MICROSCOPIC-ADD ON

## 2014-12-10 NOTE — MAU Note (Signed)
Blood sugar per EMS 88.

## 2014-12-10 NOTE — MAU Note (Signed)
Pt presents to MAU via EMS with complaints of being at church and her ears started ringing and felt like she was going to pass, pt states she did not pass out.

## 2014-12-10 NOTE — Discharge Instructions (Signed)
Second Trimester of Pregnancy The second trimester is from week 13 through week 28, months 4 through 6. The second trimester is often a time when you feel your best. Your body has also adjusted to being pregnant, and you begin to feel better physically. Usually, morning sickness has lessened or quit completely, you may have more energy, and you may have an increase in appetite. The second trimester is also a time when the fetus is growing rapidly. At the end of the sixth month, the fetus is about 9 inches long and weighs about 1 pounds. You will likely begin to feel the baby move (quickening) between 18 and 20 weeks of the pregnancy. BODY CHANGES Your body goes through many changes during pregnancy. The changes vary from woman to woman.   Your weight will continue to increase. You will notice your lower abdomen bulging out.  You may begin to get stretch marks on your hips, abdomen, and breasts.  You may develop headaches that can be relieved by medicines approved by your health care provider.  You may urinate more often because the fetus is pressing on your bladder.  You may develop or continue to have heartburn as a result of your pregnancy.  You may develop constipation because certain hormones are causing the muscles that push waste through your intestines to slow down.  You may develop hemorrhoids or swollen, bulging veins (varicose veins).  You may have back pain because of the weight gain and pregnancy hormones relaxing your joints between the bones in your pelvis and as a result of a shift in weight and the muscles that support your balance.  Your breasts will continue to grow and be tender.  Your gums may bleed and may be sensitive to brushing and flossing.  Dark spots or blotches (chloasma, mask of pregnancy) may develop on your face. This will likely fade after the baby is born.  A dark line from your belly button to the pubic area (linea nigra) may appear. This will likely fade  after the baby is born.  You may have changes in your hair. These can include thickening of your hair, rapid growth, and changes in texture. Some women also have hair loss during or after pregnancy, or hair that feels dry or thin. Your hair will most likely return to normal after your baby is born. WHAT TO EXPECT AT YOUR PRENATAL VISITS During a routine prenatal visit:  You will be weighed to make sure you and the fetus are growing normally.  Your blood pressure will be taken.  Your abdomen will be measured to track your baby's growth.  The fetal heartbeat will be listened to.  Any test results from the previous visit will be discussed. Your health care provider may ask you:  How you are feeling.  If you are feeling the baby move.  If you have had any abnormal symptoms, such as leaking fluid, bleeding, severe headaches, or abdominal cramping.  If you have any questions. Other tests that may be performed during your second trimester include:  Blood tests that check for:  Low iron levels (anemia).  Gestational diabetes (between 24 and 28 weeks).  Rh antibodies.  Urine tests to check for infections, diabetes, or protein in the urine.  An ultrasound to confirm the proper growth and development of the baby.  An amniocentesis to check for possible genetic problems.  Fetal screens for spina bifida and Down syndrome. HOME CARE INSTRUCTIONS   Avoid all smoking, herbs, alcohol, and unprescribed   drugs. These chemicals affect the formation and growth of the baby.  Follow your health care provider's instructions regarding medicine use. There are medicines that are either safe or unsafe to take during pregnancy.  Exercise only as directed by your health care provider. Experiencing uterine cramps is a good sign to stop exercising.  Continue to eat regular, healthy meals.  Wear a good support bra for breast tenderness.  Do not use hot tubs, steam rooms, or saunas.  Wear your  seat belt at all times when driving.  Avoid raw meat, uncooked cheese, cat litter boxes, and soil used by cats. These carry germs that can cause birth defects in the baby.  Take your prenatal vitamins.  Try taking a stool softener (if your health care provider approves) if you develop constipation. Eat more high-fiber foods, such as fresh vegetables or fruit and whole grains. Drink plenty of fluids to keep your urine clear or pale yellow.  Take warm sitz baths to soothe any pain or discomfort caused by hemorrhoids. Use hemorrhoid cream if your health care provider approves.  If you develop varicose veins, wear support hose. Elevate your feet for 15 minutes, 3-4 times a day. Limit salt in your diet.  Avoid heavy lifting, wear low heel shoes, and practice good posture.  Rest with your legs elevated if you have leg cramps or low back pain.  Visit your dentist if you have not gone yet during your pregnancy. Use a soft toothbrush to brush your teeth and be gentle when you floss.  A sexual relationship may be continued unless your health care provider directs you otherwise.  Continue to go to all your prenatal visits as directed by your health care provider. SEEK MEDICAL CARE IF:   You have dizziness.  You have mild pelvic cramps, pelvic pressure, or nagging pain in the abdominal area.  You have persistent nausea, vomiting, or diarrhea.  You have a bad smelling vaginal discharge.  You have pain with urination. SEEK IMMEDIATE MEDICAL CARE IF:   You have a fever.  You are leaking fluid from your vagina.  You have spotting or bleeding from your vagina.  You have severe abdominal cramping or pain.  You have rapid weight gain or loss.  You have shortness of breath with chest pain.  You notice sudden or extreme swelling of your face, hands, ankles, feet, or legs.  You have not felt your baby move in over an hour.  You have severe headaches that do not go away with  medicine.  You have vision changes. Document Released: 02/25/2001 Document Revised: 03/08/2013 Document Reviewed: 05/04/2012 Exit[employer] Patient Information 2015 [employer]. This information is not intended to replace advice given to you by your health care provider. Make sure you discuss any questions you have with your health care provider.  

## 2014-12-10 NOTE — Progress Notes (Signed)
Gerrit Heck CNM notified of pt's arrival, states she will come and see pt

## 2014-12-10 NOTE — Progress Notes (Signed)
Discharge instructions per Gerrit Heck, CNM

## 2014-12-10 NOTE — MAU Provider Note (Signed)
History    Kaitlyn Todd is a 34 y.o. G2P1001 at 24wks who presents, via EMS, for dizziness and feelings of faint.  Patient states she was at church and started having symptoms in addition to sweating and feeling hot.  Patient states she was able to walk out of the sanctuary and sit down with assistance.  Patient was given water and some sips of orange juice and reports feeling better temporarily.  Patient states she ate around 0745: 2 eggs, roll, 1/2 cupcake.  Patient reports good fetal movement and denies LOF, VB, and Ctx. Patient reports "stuffiness in my left ear," that has been ongoing for about a month.  States EMT thought it might be an "inner ear infection," but patient has recently completed prophylactic antibiotics when her toddler had the flu and reports stuffiness was better with zyrtec usage.   Patient Active Problem List   Diagnosis Date Noted  . BV (bacterial vaginosis) 01/22/2012  . Vaginal delivery 12/12/2011  . Perineal laceration with delivery, second degree 12/12/2011  . Goiter 04/28/2011  . Anxiety 04/28/2011  . 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  HPI  OB History    Gravida Para Term Preterm AB TAB SAB Ectopic Multiple Living   0 0 0 0 0 0 1      Past Medical History  Diagnosis Date  . Varicella   . H/O varicella   . H/O candidiasis   . Heart murmur   . Anxiety   . Thyroid dysfunction   . Yeast infection     Past Surgical History  Procedure Laterality Date  . Tonsillectomy      age 71  . Wisdom tooth extraction  2010    Family History  Problem Relation Age of Onset  . Hypertension Mother   . Breast cancer Mother   . Anxiety disorder Mother   . Depression Father   . Breast cancer Maternal Aunt   . Depression Maternal Uncle   . Alzheimer's disease Maternal Grandmother   . Heart disease Maternal Grandfather   . Cancer Paternal Grandmother   . Parkinsonism Paternal Grandfather     Social History  Substance Use  Topics  . Smoking status: Never Smoker   . Smokeless tobacco: Never Used  . Alcohol Use: No    Allergies:  Allergies  Allergen Reactions  . Latex Itching    Prescriptions prior to admission  Medication Sig Dispense Refill Last Dose  . liver oil-zinc oxide (DESITIN) 40 % ointment Apply 1 application topically as needed for irritation.   12/09/2014 at Unknown time  . Prenatal Vit-Fe Fumarate-FA (PRENATAL MULTIVITAMIN) TABS Take 1 tablet by mouth daily.   12/09/2014 at Unknown time  . ibuprofen (ADVIL,MOTRIN) 600 MG tablet Take 1 tablet (600 mg total) by mouth every 6 (six) hours as needed for pain. (Patient not taking: Reported on 12/10/2014) 36 tablet 2 Taking  . norethindrone (ORTHO MICRONOR) 0.35 MG tablet Take 1 tablet (0.35 mg total) by mouth daily. (Patient not taking: Reported on 12/10/2014) 1 Package 11 Taking    ROS  See HPI Above Physical Exam   Blood pressure 126/86, pulse 90, temperature 97.8 F (36.6 C), resp. rate 18, last menstrual period 06/25/2014.  Results for orders placed or performed during the hospital encounter of 12/10/14 (from the past 24 hour(s))  Urinalysis, Routine w reflex microscopic (not at [facility])     Status: Abnormal   Collection Time: 12/10/14 11:30 AM  Result Value  Ref Range   Color, Urine YELLOW YELLOW   APPearance CLOUDY (A) CLEAR   Specific Gravity, Urine <1.005 (L) 1.005 - 1.030   pH 6.0 5.0 - 8.0   Glucose, UA NEGATIVE NEGATIVE mg/dL   Hgb urine dipstick TRACE (A) NEGATIVE   Bilirubin Urine NEGATIVE NEGATIVE   Ketones, ur NEGATIVE NEGATIVE mg/dL   Protein, ur NEGATIVE NEGATIVE mg/dL   Urobilinogen, UA 0.2 0.0 - 1.0 mg/dL   Nitrite NEGATIVE NEGATIVE   Leukocytes, UA MODERATE (A) NEGATIVE  Urine microscopic-add on     Status: Abnormal   Collection Time: 12/10/14 11:30 AM  Result Value Ref Range   Squamous Epithelial / LPF FEW (A) RARE   WBC, UA 11-20 <3 WBC/hpf   Bacteria, UA MANY (A) RARE    Physical Exam  Constitutional: She is  oriented to person, place, and time. She appears well-developed and well-nourished. No distress.  HENT:  Head: Normocephalic and atraumatic.  Eyes: EOM are normal.  Neck: Normal range of motion.  Cardiovascular: Normal rate, regular rhythm and normal heart sounds.   Respiratory: Effort normal and breath sounds normal. No respiratory distress.  GI: Soft. Bowel sounds are normal.  Musculoskeletal: Normal range of motion. She exhibits edema.  Neurological: She is alert and oriented to person, place, and time.  Skin: Skin is warm and dry. She is not diaphoretic.  Psychiatric: She has a normal mood and affect.     FHR:135 bpm, Mod Var, -Decels, +Accels UC: None ED Course  Assessment: IUP at 24wks Reassuring FT Dizziness  Plan: -Reports BS 88 in ambulance -Will obtain UA for evaluation -Discussed dizziness in relation to hypoglycemia and pregnancy -Expresses concern regarding GDM history--addressed -Instructed to eat 3 major meals and small protein filled snacks in between meals -Discussed ear stuffiness;  Unlikely infection since recent completion of antibiotics. Instructed to continue zyrtec since improvement noted -Okay to return to work tomorrow with frequent snacks implemented -Keep appt as scheduled: Tuesday Sept 27, 2016  Follow-up 1214 -Nurse reports urine back-Some abnormalities -Urine Culture Sent and Pending -Instructed to discharge to home with precautions -Follow up in office as scheduled  EMLY, JESSICA LYNN CNM, MSN 12/10/2014 11:17 AM

## 2014-12-14 LAB — CULTURE, OB URINE: Culture: >=100000

## 2015-02-19 ENCOUNTER — Encounter (HOSPITAL_COMMUNITY): Payer: Self-pay | Admitting: *Deleted

## 2015-02-19 ENCOUNTER — Inpatient Hospital Stay (HOSPITAL_COMMUNITY)
Admission: AD | Admit: 2015-02-19 | Discharge: 2015-02-20 | Disposition: A | Discharge status: Home / Self Care | Payer: BC Managed Care – PPO | Source: Ambulatory Visit | Attending: Obstetrics & Gynecology | Admitting: Obstetrics & Gynecology

## 2015-02-19 DIAGNOSIS — Z3A34 34 weeks gestation of pregnancy: Secondary | ICD-10-CM | POA: Diagnosis not present

## 2015-02-19 DIAGNOSIS — R202 Paresthesia of skin: Secondary | ICD-10-CM | POA: Diagnosis present

## 2015-02-19 DIAGNOSIS — O1213 Gestational proteinuria, third trimester: Secondary | ICD-10-CM | POA: Insufficient documentation

## 2015-02-19 DIAGNOSIS — R011 Cardiac murmur, unspecified: Secondary | ICD-10-CM | POA: Insufficient documentation

## 2015-02-19 LAB — URINALYSIS, ROUTINE W REFLEX MICROSCOPIC
Bilirubin Urine: NEGATIVE
Glucose, UA: NEGATIVE mg/dL
Hgb urine dipstick: NEGATIVE
Ketones, ur: NEGATIVE mg/dL
Nitrite: NEGATIVE
Protein, ur: NEGATIVE mg/dL
Specific Gravity, Urine: <1.005 — ABNORMAL LOW (ref 1.005–1.030)
pH: 6 (ref 5.0–8.0)

## 2015-02-19 LAB — URINE MICROSCOPIC-ADD ON
RBC / HPF: NONE SEEN RBC/hpf (ref 0–5)
Squamous Epithelial / LPF: NONE SEEN

## 2015-02-19 NOTE — MAU Note (Signed)
PT SAYS SHE WENT  TO OFFIC E  TODAY -  BECAUSE   OF  SWELLING    IN FEET  AND  TINGLING  FINGERTIPS    -  THERE    THEY  DREW  LABS    AND  UA  HAD  PROTEIN-    AT  WORK  BP  WAS ELEVATED -   128/86.     NO  VE .    NO H/A,  NO BLURRED  VISION,  NO EPIGASTRIC  PAIN .     DR  CALLED  HER -   TOLD  TO COME  HERE

## 2015-02-19 NOTE — MAU Note (Signed)
Urine sent to lab 

## 2015-02-20 NOTE — MAU Provider Note (Signed)
History  34 yo G2P1001 @ 34.2 wks presented to MAU for BP monitoring. Was seen in office yesterday for high normal BP at work, swelling in feet, tingling in fingertips and head fuzziness. Called last evening inquiring about office lab results. Was asked to come in for serial BP monitoring x 4 hours to r/o preE due to proteinuria. On arrival, denied h/a, visual changes, epigastric pain, difficulty breathing, ctxs, VB or LOF. +FM.     Patient Active Problem List   Diagnosis Date Noted  . Proteinuria affecting pregnancy in third trimester, antepartum 02/19/2015  . Heart murmur 02/19/2015  . Goiter 04/28/2011  . Anxiety 04/28/2011  . 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  HPI As above OB History    Gravida Para Term Preterm AB TAB SAB Ectopic Multiple Living   0 0 0 0 0 0 1      Past Medical History  Diagnosis Date  . Varicella   . H/O varicella   . H/O candidiasis   . Heart murmur   . Anxiety   . Thyroid dysfunction   . Yeast infection     Past Surgical History  Procedure Laterality Date  . Tonsillectomy      age 53  . Wisdom tooth extraction  2010    Family History  Problem Relation Age of Onset  . Hypertension Mother   . Breast cancer Mother   . Anxiety disorder Mother   . Depression Father   . Breast cancer Maternal Aunt   . Depression Maternal Uncle   . Alzheimer's disease Maternal Grandmother   . Heart disease Maternal Grandfather   . Cancer Paternal Grandmother   . Parkinsonism Paternal Grandfather     Social History  Substance Use Topics  . Smoking status: Never Smoker   . Smokeless tobacco: Never Used  . Alcohol Use: No    Allergies:  Allergies  Allergen Reactions  . Latex Itching  . Adhesive [Tape] Swelling    No prescriptions prior to admission    ROS  +FM -VB -LOF -HA, -visual changes, -epigastric pain, -difficulty breathing Physical Exam   Results for orders placed or performed during the hospital  encounter of 02/19/15 (from the past 24 hour(s))  Urinalysis, Routine w reflex microscopic (not at [facility])     Status: Abnormal   Collection Time: 02/19/15 10:45 AM  Result Value Ref Range   Color, Urine YELLOW YELLOW   APPearance CLEAR CLEAR   Specific Gravity, Urine <1.005 (L) 1.005 - 1.030   pH 6.0 5.0 - 8.0   Glucose, UA NEGATIVE NEGATIVE mg/dL   Hgb urine dipstick NEGATIVE NEGATIVE   Bilirubin Urine NEGATIVE NEGATIVE   Ketones, ur NEGATIVE NEGATIVE mg/dL   Protein, ur NEGATIVE NEGATIVE mg/dL   Nitrite NEGATIVE NEGATIVE   Leukocytes, UA TRACE (A) NEGATIVE  Urine microscopic-add on     Status: Abnormal   Collection Time: 02/19/15 10:45 AM  Result Value Ref Range   Squamous Epithelial / LPF NONE SEEN NONE SEEN   WBC, UA 0-5 0 - 5 WBC/hpf   RBC / HPF NONE SEEN 0 - 5 RBC/hpf   Bacteria, UA RARE (A) NONE SEEN   Blood pressure 109/73, pulse 77, temperature 98 F (36.7 C), temperature source Oral, resp. rate 18, height  (1.676 m), weight 86.297 kg (190 lb 4 oz), last menstrual period 06/25/2014.  BP range during 4-hr monitoring period = 100-127/63-74  PCR 0.385 mg/dL in office  Physical  Exam Gen: NAD Lungs: CTAB CV: RRR Abdomen: gravid, soft, NT, no guarding or rebound Pelvic: Deferred FHRT: BL 120 w/ moderate variability, +accels, no decels Toco: No ctxs ED Course  Assessment: No concerns for preE at this time Cat 1 FHRT  Plan: Reassurance Strict preE precautions Increase water intake & elevate feet OOW 02/20/15 Strict PTL precautions OB f/u as scheduled, sooner if indicated   Sherre ScarletWILLIAMS, Kaitlyn Todd CNM, MS 02/20/2015 3:42 AM

## 2015-02-20 NOTE — Discharge Instructions (Signed)
Proteinuria  Proteinuria is a condition in which urine contains more protein than is normal. Proteinuria is either a sign that your body is producing too much protein or a sign that there is a problem with the kidneys. Healthy kidneys prevent most substances that the body needs, including proteins, from leaving the bloodstream and ending up in urine.  CAUSES   Proteinuria may be caused by a temporary event or condition such as stress, exercise, or fever, and go away on its own. Proteinuria may also be a symptom of a more serious condition or disease. Causes of proteinuria include:  · A kidney disease caused by:    Diabetes.    High blood pressure (hypertension).      A disease that affects the immune system, such as lupus.    A genetic disease, such as Alport's syndrome.    Medicines that damage the kidneys, such as long-term nonsteroidal anti-inflammatory drugs (NSAIDs).    Poisoning or exposure to toxic substances.    A reoccurring kidney or urinary infection.  · Excess protein production in the body caused by:    Multiple myeloma.    Amyloidosis.  SYMPTOMS  You may have proteinuria without having noticeable symptoms. If there is a large amount of protein in your urine, your urine may look foamy. You may also notice swelling (edema) in your hands, feet, abdomen, or face.  DIAGNOSIS  To determine whether you have proteinuria, you will need to provide a urine sample. Your urine will then be tested for too much protein and the main blood protein albumin.  If your test shows that you have proteinuria, you may need to take additional tests to determine its cause, how much protein is in your urine, and what type of protein is being lost. Tests may include:  · Blood tests.  · Urine tests.  · A blood pressure measurement.  · Imaging tests.  TREATMENT   Treatment will depend on the cause of your proteinuria. Your caregiver will discuss treatment options with you after you have been diagnosed. If your proteinuria is mild or  temporary, no treatment may be necessary.  HOME CARE INSTRUCTIONS  Ask your caregiver if monitoring the level of protein in your urine at home using simple testing strips is appropriate for you. Early detection of proteinuria can lead to early and often successful treatment of the condition causing it.     This information is not intended to replace advice given to you by your health care provider. Make sure you discuss any questions you have with your health care provider.     Document Released: 04/23/2005 Document Revised: 11/26/2011 Document Reviewed: 08/01/2011  Elsevier Interactive Patient Education ©2016 [employer]

## 2015-03-05 LAB — OB RESULTS CONSOLE GBS: GBS: NEGATIVE

## 2015-03-18 NOTE — L&D Delivery Note (Signed)
After receiving Epidural w/ optimal relief, pt progressed rapidly to C/C/+1. Max Pitocin 12 mU/min. FHR pattern remained reassuring throughout 2nd stage, with ctxs occurring every 1-3 minutes. Pt was w/o preE sxs and BPs remained within normal parameters. With the support of staff, spouse, friend and doula, she went on to deliver as follows:  Delivery Note At 5:54 AM a viable female "Kaitlyn Todd" was delivered via Vaginal, Spontaneous Delivery (Presentation: Occiput Anterior restituting to LOA). Pitocin ongoing in anticipation of PPH due to suspected LGA infant. APGARS: 8, 9; weight 9lbs 9.8 oz (4360 g).   Placenta status: Intact, Spontaneous.  Cord: 3 vessels with the following complications: None.  Cord pH: NA. ~ 300 ml uop obtained via straight cath.  Anesthesia: Epidural  Episiotomy: None Lacerations: None Suture Repair: NA Est. Blood Loss (mL): 250  Mom to postpartum.  Baby to [name] care / Skin to Skin.  Mom plans to breastfeed.  Couple elects inpatient circumcision. Consent obtained and witnessed.  Mom will either have Paragard IUD or BTL, or spouse will undergo vasectomy.  Kaitlyn Todd, Kaitlyn Todd 04/01/2015, 6:26 AM

## 2015-03-30 ENCOUNTER — Encounter (HOSPITAL_COMMUNITY): Payer: Self-pay | Admitting: *Deleted

## 2015-03-30 ENCOUNTER — Inpatient Hospital Stay (HOSPITAL_COMMUNITY)
Admission: AD | Admit: 2015-03-30 | Discharge: 2015-04-03 | DRG: 775 | Disposition: A | Discharge status: Home / Self Care | Payer: BC Managed Care – PPO | Source: Ambulatory Visit | Attending: Obstetrics & Gynecology | Admitting: Obstetrics & Gynecology

## 2015-03-30 DIAGNOSIS — Z91040 Latex allergy status: Secondary | ICD-10-CM

## 2015-03-30 DIAGNOSIS — IMO0002 Reserved for concepts with insufficient information to code with codable children: Secondary | ICD-10-CM

## 2015-03-30 DIAGNOSIS — O1410 Severe pre-eclampsia, unspecified trimester: Secondary | ICD-10-CM | POA: Diagnosis present

## 2015-03-30 DIAGNOSIS — Z820 Family history of epilepsy and other diseases of the nervous system: Secondary | ICD-10-CM

## 2015-03-30 DIAGNOSIS — O1404 Mild to moderate pre-eclampsia, complicating childbirth: Secondary | ICD-10-CM | POA: Diagnosis present

## 2015-03-30 DIAGNOSIS — Z8249 Family history of ischemic heart disease and other diseases of the circulatory system: Secondary | ICD-10-CM

## 2015-03-30 DIAGNOSIS — R030 Elevated blood-pressure reading, without diagnosis of hypertension: Secondary | ICD-10-CM | POA: Diagnosis present

## 2015-03-30 DIAGNOSIS — Z91048 Other nonmedicinal substance allergy status: Secondary | ICD-10-CM

## 2015-03-30 DIAGNOSIS — O3663X0 Maternal care for excessive fetal growth, third trimester, not applicable or unspecified: Secondary | ICD-10-CM | POA: Diagnosis present

## 2015-03-30 DIAGNOSIS — Z3A39 39 weeks gestation of pregnancy: Secondary | ICD-10-CM | POA: Diagnosis not present

## 2015-03-30 DIAGNOSIS — O1400 Mild to moderate pre-eclampsia, unspecified trimester: Secondary | ICD-10-CM

## 2015-03-30 LAB — CBC
HCT: 38.1 % (ref 36.0–46.0)
Hemoglobin: 13.2 g/dL (ref 12.0–15.0)
MCH: 29.3 pg (ref 26.0–34.0)
MCHC: 34.6 g/dL (ref 30.0–36.0)
MCV: 84.7 fL (ref 78.0–100.0)
Platelets: 227 10*3/uL (ref 150–400)
RBC: 4.5 MIL/uL (ref 3.87–5.11)
RDW: 14.8 % (ref 11.5–15.5)
WBC: 13.2 10*3/uL — ABNORMAL HIGH (ref 4.0–10.5)

## 2015-03-30 LAB — TYPE AND SCREEN
ABO/RH(D): A POS
Antibody Screen: NEGATIVE

## 2015-03-30 MED ORDER — OXYCODONE-ACETAMINOPHEN 5-325 MG PO TABS: 1.0000 | ORAL_TABLET | ORAL | Status: DC | PRN

## 2015-03-30 MED ORDER — OXYTOCIN BOLUS FROM INFUSION
500.0000 mL | INTRAVENOUS | Status: DC
  Administered 2015-04-01: 500 mL via INTRAVENOUS

## 2015-03-30 MED ORDER — LACTATED RINGERS IV SOLN
INTRAVENOUS | Status: DC
  Administered 2015-03-31 – 2015-04-01 (×2): via INTRAVENOUS

## 2015-03-30 MED ORDER — OXYCODONE-ACETAMINOPHEN 5-325 MG PO TABS: 2.0000 | ORAL_TABLET | ORAL | Status: DC | PRN

## 2015-03-30 MED ORDER — ONDANSETRON HCL 4 MG/2ML IJ SOLN: 4.0000 mg | Freq: Four times a day (QID) | INTRAMUSCULAR | Status: DC | PRN

## 2015-03-30 MED ORDER — OXYTOCIN 10 UNIT/ML IJ SOLN: 2.5000 [IU]/h | INTRAVENOUS | Status: DC

## 2015-03-30 MED ORDER — TERBUTALINE SULFATE 1 MG/ML IJ SOLN
0.2500 mg | Freq: Once | INTRAMUSCULAR | Status: DC | PRN
  Filled 2015-03-30: qty 1

## 2015-03-30 MED ORDER — HYDRALAZINE HCL 20 MG/ML IJ SOLN: 10.0000 mg | Freq: Once | INTRAMUSCULAR | Status: DC | PRN

## 2015-03-30 MED ORDER — FLEET ENEMA 7-19 GM/118ML RE ENEM: 1.0000 | ENEMA | RECTAL | Status: DC | PRN

## 2015-03-30 MED ORDER — MISOPROSTOL 25 MCG QUARTER TABLET
25.0000 ug | ORAL_TABLET | ORAL | Status: DC | PRN
  Administered 2015-03-30: 25 ug via VAGINAL
  Filled 2015-03-30: qty 1
  Filled 2015-03-30 (×2): qty 0.25

## 2015-03-30 MED ORDER — ACETAMINOPHEN 325 MG PO TABS: 650.0000 mg | ORAL_TABLET | ORAL | Status: DC | PRN

## 2015-03-30 MED ORDER — HYDROXYZINE HCL 50 MG PO TABS
50.0000 mg | ORAL_TABLET | Freq: Four times a day (QID) | ORAL | Status: DC | PRN
  Filled 2015-03-30: qty 1

## 2015-03-30 MED ORDER — LABETALOL HCL 5 MG/ML IV SOLN
20.0000 mg | INTRAVENOUS | Status: DC | PRN
Start: 2015-03-30 — End: 2015-04-01

## 2015-03-30 MED ORDER — LIDOCAINE HCL (PF) 1 % IJ SOLN
30.0000 mL | INTRAMUSCULAR | Status: DC | PRN
  Filled 2015-03-30: qty 30

## 2015-03-30 MED ORDER — OXYTOCIN 10 UNIT/ML IJ SOLN
1.0000 m[IU]/min | INTRAVENOUS | Status: DC
  Administered 2015-03-31: 1 m[IU]/min via INTRAVENOUS
  Filled 2015-03-30: qty 4

## 2015-03-30 MED ORDER — NALBUPHINE HCL 10 MG/ML IJ SOLN
10.0000 mg | INTRAMUSCULAR | Status: DC | PRN
  Administered 2015-04-01: 10 mg via INTRAVENOUS
  Filled 2015-03-30: qty 1

## 2015-03-30 MED ORDER — CITRIC ACID-SODIUM CITRATE 334-500 MG/5ML PO SOLN: 30.0000 mL | ORAL | Status: DC | PRN

## 2015-03-30 MED ORDER — LACTATED RINGERS IV SOLN: 500.0000 mL | INTRAVENOUS | Status: DC | PRN

## 2015-03-30 NOTE — H&P (Signed)
Kaitlyn PacasRuth Todd is a 35 y.o. female, G2P1001 at 39.5 weeks, admitted for IOL due to proteinuria, headache and elevated BP.   Was seen in the office today for a routine visit. She complained of a headache and preE labs were obtained. PCR was noted to be 0.47, with remaining labs normal. On presentation, she endorses a 3/10 headache, +FM, feeling hot and ongoing nausea. States "I just don't feel well." She denies visual changes, epigastric pain, CP, difficulty breathing, vomiting or weakness. Initial BP 140/83.  Became tearful when informed of dx and recommendation. Had elaborate birthplan that included a non-medicated WB delivery w/ use of a doula Kaitlyn Todd(Kaitlyn Todd from [employer]) and intermittent monitoring.      Patient Active Problem List   Diagnosis Date Noted  . 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 Mild preeclampsia 03/30/2015  . Large for gestational age fetus 03/30/2015  . Goiter 04/28/2011  . Anxiety 04/28/2011  . 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   EDC of 04/01/15 was established by u/s at 11.5 wks.   Anatomy scan: 19.5 weeks. Singleton pregnancy. Breech presentation. Anterior placenta (placental edge 6.6 cm from internal os - normal). Cvx closed. Normal fluid (vertical pocket = 5.8 cm). Profile/palate, nasal bone - not seen. Philtrum - seen. Open hands. 5th digit, heel and feet seen. Ovaries and adnexas within normal limits.   Additional US evaluations: 11.5 wks (dating): Singleton. +FHTs. Anteverted uterus. Amnion seen. Placenta appears to be anterior. Normal fluid. U/S is best GA/EDD. Cvx closed. Ovaries/adnexas WNLs. 24.2 wks (f/u anatomy): Singleton pregnancy. Vertex presentation. Anterior placenta. Normal fluid - vertical pocket = 5.2 cm. Cvx closed. Adnexas unremarkable. Profile, palate, philtrum, NB seen - anatomy complete. EFW 783g (1 lb 12 oz; 80.4%tile). 37.4 wks (S>D): EFW 9+1 (4109 g) = > 90th%ILE. NORMAL FLUID. VTX. BPD,  HC, AC ALL S>D, FL=DATING.  Significant prenatal events: Several pregnancy related complaints. Followed by Dr. Gaynelle CageKeer (Endocrinologist) for goiter - no meds. Fluid in ear - Zyrtec advised. Flu like sxs around 22 wks - neg flu swab - rest/hydration encouraged. Treated presumptively for UTI (100K enterococcus) - TOC neg. Monilial rash to inner thighs around 33 wks - Rx'd Nystatin topical powder - perineal hygiene encouraged. Swollen feet, high normal BP and head fuzziness while at work at 34 wks - preE labs completed w/ PCR 0.39; remaining labs normal - seen in MAU for serial BP checks as a result. D/C'd home after 4 hrs of monitoring/normal BPs - was to have close follow-up until delivery. Attended 2 acupuncture sessions this past week 2/2 nausea and anxiety w/ good result. Received both Tdap and flu on 01/02/15. GBS neg. H/O anxiety/some difficulty adjusting after first delivery - no meds. H/O 9 lb 10 oz infant - normal glucola x 2. TWG: 30 lbs.  Last evaluation: Office, 03/30/15 by Kaitlyn Todd, Kaitlyn Todd @ 39.5 wks. Cvx 0/0/-2. BP 114/68. Wt: 192 lbs. No edema. 1+ glucosuria.  OB History    Gravida Para Term Preterm AB TAB SAB Ectopic Multiple Living   2 1 1  0 0 0 0 0 0 1     SVD on 12/12/2011 @ 40 wks, female infant, birthwt 9+10  Past Medical History  Diagnosis Date  . Varicella   . H/O varicella   . H/O candidiasis   . Heart murmur   . Anxiety   . Thyroid dysfunction   . Yeast infection    Past Surgical History  Procedure Laterality  Date  . Tonsillectomy      age 71  . Wisdom tooth extraction  2010   Family History: family history includes Alzheimer's disease in her maternal grandmother; Anxiety disorder in her mother; Breast cancer in her maternal aunt and mother; Cancer in her paternal grandmother; Depression in her father and maternal uncle; Heart disease in her maternal grandfather; Hypertension in her mother; Parkinsonism in her paternal grandfather.Hypertension and glaucoma in her father. Social  History:  reports that she has never smoked. She has never used smokeless tobacco. She reports that she does not drink alcohol or use illicit drugs. Patient is married, with FOB Kaitlyn Todd) involved and supportive. 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 She will accept blood in an emergency.    Prenatal Transfer Tool  Maternal Diabetes: No Genetic Screening: Declined Maternal Ultrasounds/Referrals: Normal Fetal Ultrasounds or other Referrals:  None Maternal Substance Abuse:  No Significant Maternal Medications:  Meds include: Other: Nystop 100,000 unit/gram topical powder, PNV, DHA supplement Significant Maternal Lab Results: Lab values include: Group B Strep negative  TDAP: 01/02/15 Flu: 01/02/15  ROS:10 Systems reviewed and are negative for acute change except as noted in the HPI.    Allergies  Allergen Reactions  . Latex Itching  . Adhesive [Tape] Swelling     Dilation: 1 Effacement (%): 50 Station: -3 Exam by:: Kaitlyn Todd Kaitlyn Todd Blood pressure 128/59, pulse 83, temperature 98 F (36.7 C), temperature source Oral, resp. rate 18, height 5\' 6"  (1.676 m), weight 87.091 kg (192 lb), last menstrual period 06/25/2014.  Gen: Tearful Neck: Thyromegaly Chest clear Heart RRR w/o M/R/G Abd gravid, NT, FH 41 cm Pelvic: Long/closed at office Ext: DTRs 2+ bilaterally, no clonus, no edema EFW: 9 3/4 lbs Cephalic by Leopolds Bishop score: 3 FHR: BL 130 w/ moderate variability, +accels, no decels UCs: Irregular  Prenatal labs: ABO, Rh: --/--/A POS, A POS (01/13 2049) Antibody: NEG (01/13 2049) Rubella: Immune (08/30/14)  RPR: Nonreactive (06/15 0000)  HBsAg: Negative (06/15 0000)  HIV: Non-reactive (06/15 0000)  GBS: Negative (12/19 0000) Sickle cell/Hgb electrophoresis: NA Pap: Neg (06/09/2012) GC: Neg (08/30/14) Chlamydia: Neg (08/30/14) Genetic screenings: Declined Glucola: Normal Other: Flu swab neg (11/29/14);  Thyroid panel (02/14/15) - normal T4, low T3 uptake, normal free thyroxine index, normal TSH Hgb 13.8 at NOB, 12.8 at 28 weeks   Assessment: IUP at 39.5 wks. S>D. preE w/o severe features Cat 1 FHRT H/O LGA with first baby & LGA fetus this pregnancy Latex & adhesive allergies H/O Goiter H/O Anxiety Not a WB candidate Unfavorable cervix GBS neg  Plan: Admit to [facility] per consult with Dr. Charlotta Newton. Reviewed options of induction to include Cytotec, Foley bulb, AROM and Pitocin. In light of unfavorable cvx, will begin w/ Cytotec. Reviewed R&B of induction with patient and husband, including need for serial induction, risk of C/S, need for further intervention. Patient and husband seem to understand these risks and are agreeable with proceeding.  Routine [facility] orders. preE labs in a.m. Will obtain Thyroid panel with a.m. labs as last set was 02/14/15. Will proceed w/ IV Labetalol &/or Hydralazine for BP > 160/110.  Will proceed w/ Magnesium Sulfate if dx becomes preE w/ severe features. Pain med/epidural prn.    Robyne Askew, MS 03/30/15, 8 PM

## 2015-03-30 NOTE — MAU Note (Signed)
Mild headache, had flashes of light earlier today, saw MD today had protein in urine.

## 2015-03-30 NOTE — MAU Note (Signed)
Urine sent to lab 

## 2015-03-31 DIAGNOSIS — Z91048 Other nonmedicinal substance allergy status: Secondary | ICD-10-CM

## 2015-03-31 LAB — COMPREHENSIVE METABOLIC PANEL
ALT: 14 U/L (ref 14–54)
AST: 20 U/L (ref 15–41)
Albumin: 3.2 g/dL — ABNORMAL LOW (ref 3.5–5.0)
Alkaline Phosphatase: 139 U/L — ABNORMAL HIGH (ref 38–126)
Anion gap: 8 (ref 5–15)
BUN: 8 mg/dL (ref 6–20)
CO2: 19 mmol/L — ABNORMAL LOW (ref 22–32)
Calcium: 8.8 mg/dL — ABNORMAL LOW (ref 8.9–10.3)
Chloride: 110 mmol/L (ref 101–111)
Creatinine, Ser: 0.48 mg/dL (ref 0.44–1.00)
GFR calc Af Amer: >60 mL/min (ref >60)
GFR calc non Af Amer: >60 mL/min (ref >60)
Glucose, Bld: 92 mg/dL (ref 65–99)
Potassium: 3.8 mmol/L (ref 3.5–5.1)
Sodium: 137 mmol/L (ref 135–145)
Total Bilirubin: 0.6 mg/dL (ref 0.3–1.2)
Total Protein: 6 g/dL — ABNORMAL LOW (ref 6.5–8.1)

## 2015-03-31 LAB — CBC
HCT: 38.6 % (ref 36.0–46.0)
Hemoglobin: 13.1 g/dL (ref 12.0–15.0)
MCH: 28.5 pg (ref 26.0–34.0)
MCHC: 33.9 g/dL (ref 30.0–36.0)
MCV: 84.1 fL (ref 78.0–100.0)
Platelets: 205 10*3/uL (ref 150–400)
RBC: 4.59 MIL/uL (ref 3.87–5.11)
RDW: 14.8 % (ref 11.5–15.5)
WBC: 15 10*3/uL — ABNORMAL HIGH (ref 4.0–10.5)

## 2015-03-31 LAB — TSH: TSH: 1.065 u[IU]/mL (ref 0.350–4.500)

## 2015-03-31 LAB — ABO/RH: ABO/RH(D): A POS

## 2015-03-31 LAB — RPR: RPR Ser Ql: NONREACTIVE

## 2015-03-31 LAB — LACTATE DEHYDROGENASE: LDH: 106 U/L (ref 98–192)

## 2015-03-31 LAB — URIC ACID: Uric Acid, Serum: 3 mg/dL (ref 2.3–6.6)

## 2015-03-31 LAB — T4, FREE: Free T4: 0.63 ng/dL (ref 0.61–1.12)

## 2015-03-31 NOTE — Progress Notes (Signed)
Subjective: Doing well.  States, "nothig going on. Ready now for cervical check/ potentially beginning pitocin.   Objective: BP 120/85 mmHg  Pulse 109  Temp(Src) 98.5 F (36.9 C) (Oral)  Resp 16  Ht 5\' 6"  (1.676 m)  Wt 87.091 kg (192 lb)  BMI 31.00 kg/m2  LMP 06/25/2014       Filed Vitals:   03/31/15 1252 03/31/15 1415 03/31/15 1525 03/31/15 1645  BP: 137/71 128/75 144/80 120/85  Pulse: 89 97 94 109  Temp:      TempSrc:      Resp: 16     Height:      Weight:        FHT: Category 1,  135 bpm  Moderate variability, +accels, no decels UC:   regular, every 2-5 minutes, mild SVE:   Dilation: 3.5 Effacement (%): 50 Station: -3, -2 Exam by:: R Heatherly Stenner, CNM Membranes intact S/p Cytotec x1 dose Foley Bulb placement at 0554 - out at 1645 No pitocin Non pharmacologic pain management  Assessment:  IOL due to preE w/o severe features Latent phase labor Foley Bulb out at 1645 Cat 1 FHRT Normotensive  Plan:  Discussed R/B of pitocin induction . Pt agreeable to begin pitocin Pt desires to wait for AROM  Pain medication/Epidural, PRN  Begin pitocin 1x2 mu/min per protocol Consult prn Expect progress and SVD  Alphonzo Severanceachel Anely Spiewak CNM, [place] 03/31/2015, 4:56 PM

## 2015-03-31 NOTE — Progress Notes (Addendum)
Sleeping, yet easily aroused. FOB at bedside.  Subjective: Endorses FM. H/A resolved. Denies visual changes, epigastric pain or difficulty breathing.   Objective: BP 138/72 mmHg  Pulse 89  Temp(Src) 98.3 F (36.8 C) (Oral)  Resp 18  Ht 5\' 6"  (1.676 m)  Wt 87.091 kg (192 lb)  BMI 31.00 kg/m2  LMP 06/25/2014   Today's Vitals   03/30/15 2032 03/30/15 2130 03/30/15 2200 03/30/15 2315  BP:  117/75 125/73 138/72  Pulse:  101 89 89  Temp: 98.3 F (36.8 C)     TempSrc: Oral     Resp:      Height: 5\' 6"  (1.676 m)     Weight: 87.091 kg (192 lb)      FHT: BL 130s w/ moderate variability, +accels, occ variable (resolved w/ position change), no lates UC:   irregular, every 3-4 minutes SVE: Deferred  Cytotec #1 placed in posterior fornix at 21:26  Assessment:  IOL 2/2 preE w/o severe features Overall reassuring FHRT Normotensive GBS neg  Plan: Defer placement of 2nd Cytotec due to frequency of ctxs. Will monitor closely and proceed w/ 2nd dose if ctxs space out, or place intracervical balloon. Repeat preE labs and thyroid panel at 0500. Consult prn. Anticipate progress and SVD.   Sherre ScarletWILLIAMS, Forever Arechiga CNM 03/31/2015, 1:45 AM

## 2015-03-31 NOTE — Progress Notes (Signed)
Subjective: Resting and "waiting". Coping well with contractions. Family (pts mother & father)  at bedside for support.   Objective: BP 131/77 mmHg  Pulse 90  Temp(Src) 98.2 F (36.8 C) (Oral)  Resp 16  Ht 5\' 6"  (1.676 m)  Wt 87.091 kg (192 lb)  BMI 31.00 kg/m2  LMP 06/25/2014      Filed Vitals:   03/31/15 0756 03/31/15 0902 03/31/15 1030 03/31/15 1130  BP: 132/78 142/83 116/72 131/77  Pulse: 102 99 103 90  Temp: 98.2 F (36.8 C)     TempSrc: Oral     Resp: 16 16    Height:      Weight:       Results for orders placed or performed during the hospital encounter of 03/30/15 (from the past 24 hour(s))  CBC     Status: Abnormal   Collection Time: 03/30/15  8:49 PM  Result Value Ref Range   WBC 13.2 (H) 4.0 - 10.5 K/uL   RBC 4.50 3.87 - 5.11 MIL/uL   Hemoglobin 13.2 12.0 - 15.0 g/dL   HCT 78.2 95.6 - 21.3 %   MCV 84.7 78.0 - 100.0 fL   MCH 29.3 26.0 - 34.0 pg   MCHC 34.6 30.0 - 36.0 g/dL   RDW 08.6 57.8 - 46.9 %   Platelets 227 150 - 400 K/uL  Type and screen [facility]     Status: None   Collection Time: 03/30/15  8:49 PM  Result Value Ref Range   ABO/RH(D) A POS    Antibody Screen NEG    Sample Expiration 04/02/2015   RPR     Status: None   Collection Time: 03/30/15  8:49 PM  Result Value Ref Range   RPR Ser Ql Non Reactive Non Reactive  ABO/Rh     Status: None   Collection Time: 03/30/15  8:49 PM  Result Value Ref Range   ABO/RH(D) A POS   CBC     Status: Abnormal   Collection Time: 03/31/15  5:55 AM  Result Value Ref Range   WBC 15.0 (H) 4.0 - 10.5 K/uL   RBC 4.59 3.87 - 5.11 MIL/uL   Hemoglobin 13.1 12.0 - 15.0 g/dL   HCT 62.9 52.8 - 41.3 %   MCV 84.1 78.0 - 100.0 fL   MCH 28.5 26.0 - 34.0 pg   MCHC 33.9 30.0 - 36.0 g/dL   RDW 24.4 01.0 - 27.2 %   Platelets 205 150 - 400 K/uL  Comprehensive metabolic panel     Status: Abnormal   Collection Time: 03/31/15  5:55 AM  Result Value Ref Range   Sodium 137 135 - 145 mmol/L   Potassium  3.8 3.5 - 5.1 mmol/L   Chloride 110 101 - 111 mmol/L   CO2 19 (L) 22 - 32 mmol/L   Glucose, Bld 92 65 - 99 mg/dL   BUN 8 6 - 20 mg/dL   Creatinine, Ser 5.36 0.44 - 1.00 mg/dL   Calcium 8.8 (L) 8.9 - 10.3 mg/dL   Total Protein 6.0 (L) 6.5 - 8.1 g/dL   Albumin 3.2 (L) 3.5 - 5.0 g/dL   AST 20 15 - 41 U/L   ALT 14 14 - 54 U/L   Alkaline Phosphatase 139 (H) 38 - 126 U/L   Total Bilirubin 0.6 0.3 - 1.2 mg/dL   GFR calc non Af Amer >60 >60 mL/min   GFR calc Af Amer >60 >60 mL/min   Anion gap 8 5 - 15  Lactate dehydrogenase     Status: None   Collection Time: 03/31/15  5:55 AM  Result Value Ref Range   LDH 106 98 - 192 U/L  Uric acid     Status: None   Collection Time: 03/31/15  5:55 AM  Result Value Ref Range   Uric Acid, Serum 3.0 2.3 - 6.6 mg/dL  TSH     Status: None   Collection Time: 03/31/15  5:55 AM  Result Value Ref Range   TSH 1.065 0.350 - 4.500 uIU/mL  T4, free     Status: None   Collection Time: 03/31/15  5:55 AM  Result Value Ref Range   Free T4 0.63 0.61 - 1.12 ng/dL     FHT: Category 1, Bl 135 bpm, moderate variability, +accels, no decels UC:   regular, every 1-4 minutes SVE:   Dilation: 1 Effacement (%): 50 Station: -3 Exam by:: K.Williams CNM Membranes intact S/p Cytotec x1 dose Foley Bulb placement at 0554 No pitocin    Assessment:  IOL due to preE w/o severe features Latent phase labor Foley Bulb in place Cat 1 FHRT Normotensive  Plan: Continue current management as ordered Gentle tension on foley bulb every hour- requested Rn to call CNM when foley bulb falls out Continue induction - evaluate next method of induction potentially pitocin, when foley bulb has been expelled.  Consult prn Expect progress and SVD  Alphonzo Severanceachel Adisynn Suleiman CNM, [place] 03/31/2015, 12:16 PM

## 2015-03-31 NOTE — Progress Notes (Signed)
Subjective: In to meet the acquaintance of patient and husband.  Doula in room for labor support.  Coping well with contractions and cramping.   Objective: BP 132/78 mmHg  Pulse 102  Temp(Src) 98.2 F (36.8 C) (Oral)  Resp 16  Ht 5\' 6"  (1.676 m)  Wt 87.091 kg (192 lb)  BMI 31.00 kg/m2  LMP 06/25/2014       Filed Vitals:   03/31/15 0400 03/31/15 0500 03/31/15 0700 03/31/15 0756  BP: 136/66 128/59 137/74 132/78  Pulse: 82 83 91 102  Temp:    98.2 F (36.8 C)  TempSrc:    Oral  Resp:    16  Height:      Weight:        FHT: Category 1, BL 125 bpm, moderate variability, +accels, no decels UC:   regular, every 1-4 minutes SVE:   Dilation: 1 Effacement (%): 50 Station: -3 Exam by:: K.Williams CNM Membranes intact S/p Cytotec x1 dose Foley Bulb placement at 0554 No pitocin  Assessment:  IOL due to preE w/o severe features Latent phase labor Cat 1 FHRT Normotensive  Plan: Gentle tension on foley bulb every hour- requested Rn to call CNM when foley bulb falls out Assessed patient desire for pain management  - desires to delay pharmacologic management of pain as long as she is able  Continue induction - evaluate next method of induction potentially pitocin, when foley bulb has been expelled.  Consult prn Expect progress and SVD   Alphonzo Severanceachel Everlean Bucher CNM, [place] 03/31/2015, 8:49 AM

## 2015-03-31 NOTE — Progress Notes (Addendum)
Reports ~ 1.5 hrs of uninterrupted sleep. FOB at bedside and slept as well.  Subjective: Denies h/a, visual changes, epigastric pain or difficulty breathing. Active fetus. Feeling some of the ctxs.  Objective: BP 128/59 mmHg  Pulse 83  Temp(Src) 98 F (36.7 C) (Oral)  Resp 18  Ht 5\' 6"  (1.676 m)  Wt 87.091 kg (192 lb)  BMI 31.00 kg/m2  LMP 06/25/2014   Today's Vitals   03/31/15 0150 03/31/15 0350 03/31/15 0400 03/31/15 0500  BP: 133/72 135/61 136/66 128/59  Pulse: 89 80 82 83  Temp:      TempSrc:      Resp:      Height:      Weight:        FHT: BL 135 w/ moderate variability, +accels, no decels UC:   irregular, every 2-3 minutes, palpate mild SVE: 1/50/posterior  Intracervical balloon placed at 0554 via speculum. Balloon filled w/ 60 ml fluid. Traction applied. Catheter taped to inner R thigh.   Assessment:  IOL due to preE w/o severe features Latent phase labor Cat 1 FHRT Normotensive  Plan: Continue induction Consult prn Expect progress and SVD  Sherre ScarletWILLIAMS, Khadim Lundberg CNM 03/31/2015, 6:02 AM

## 2015-03-31 NOTE — Progress Notes (Addendum)
Assuming care of Kaitlyn Todd, 35 yo G2P1001 @ 39.6 wks admitted last evening 2/2 preE w/o severe features. Spouse, doula & friend present and supportive.  Subjective: +FM. Denies h/a, visual changes, epigastric pain, difficulty breathing or ctxs.   Objective: BP 143/88 mmHg  Pulse 94  Temp(Src) 97.7 F (36.5 C) (Oral)  Resp 16  Ht 5\' 6"  (1.676 m)  Wt 87.091 kg (192 lb)  BMI 31.00 kg/m2  LMP 06/25/2014   Today's Vitals   03/31/15 1831 03/31/15 1901 03/31/15 1931 03/31/15 1943  BP: 115/79 124/81 129/84 143/88  Pulse: 116 95 110 94  Temp:    97.7 F (36.5 C)  TempSrc:    Oral  Resp:    16  Height:      Weight:      PainSc: 0-No pain   0-No pain   Has not required IV antihypertensives  Results for orders placed or performed during the hospital encounter of 03/30/15 (from the past 24 hour(s))  CBC     Status: Abnormal   Collection Time: 03/30/15  8:49 PM  Result Value Ref Range   WBC 13.2 (H) 4.0 - 10.5 K/uL   RBC 4.50 3.87 - 5.11 MIL/uL   Hemoglobin 13.2 12.0 - 15.0 g/dL   HCT 16.138.1 09.636.0 - 04.546.0 %   MCV 84.7 78.0 - 100.0 fL   MCH 29.3 26.0 - 34.0 pg   MCHC 34.6 30.0 - 36.0 g/dL   RDW 40.914.8 81.111.5 - 91.415.5 %   Platelets 227 150 - 400 K/uL  [facility] [place] [facility]     Status: None   Collection Time: 03/30/15  8:49 PM  Result Value Ref Range   ABO/RH(D) A POS    Antibody Screen NEG    Sample Expiration 04/02/2015   RPR     Status: None   Collection Time: 03/30/15  8:49 PM  Result Value Ref Range   RPR Ser Ql Non Reactive Non Reactive  ABO/Rh     Status: None   Collection Time: 03/30/15  8:49 PM  Result Value Ref Range   ABO/RH(D) A POS   CBC     Status: Abnormal   Collection Time: 03/31/15  5:55 AM  Result Value Ref Range   WBC 15.0 (H) 4.0 - 10.5 K/uL   RBC 4.59 3.87 - 5.11 MIL/uL   Hemoglobin 13.1 12.0 - 15.0 g/dL   HCT 78.238.6 95.636.0 - 21.346.0 %   MCV 84.1 78.0 - 100.0 fL   MCH 28.5 26.0 - 34.0 pg   MCHC 33.9 30.0 - 36.0 g/dL   RDW 08.614.8 57.811.5 -  46.915.5 %   Platelets 205 150 - 400 K/uL  Comprehensive metabolic panel     Status: Abnormal   Collection Time: 03/31/15  5:55 AM  Result Value Ref Range   Sodium 137 135 - 145 mmol/L   Potassium 3.8 3.5 - 5.1 mmol/L   Chloride 110 101 - 111 mmol/L   CO2 19 (L) 22 - 32 mmol/L   Glucose, Bld 92 65 - 99 mg/dL   BUN 8 6 - 20 mg/dL   Creatinine, Ser 6.290.48 0.44 - 1.00 mg/dL   Calcium 8.8 (L) 8.9 - 10.3 mg/dL   Total Protein 6.0 (L) 6.5 - 8.1 g/dL   Albumin 3.2 (L) 3.5 - 5.0 g/dL   AST 20 15 - 41 U/L   ALT 14 14 - 54 U/L   Alkaline Phosphatase 139 (H) 38 - 126 U/L   Total Bilirubin  0.6 0.3 - 1.2 mg/dL   GFR calc non Af Amer >60 >60 mL/min   GFR calc Af Amer >60 >60 mL/min   Anion gap 8 5 - 15  Lactate dehydrogenase     Status: None   Collection Time: 03/31/15  5:55 AM  Result Value Ref Range   LDH 106 98 - 192 U/L  Uric acid     Status: None   Collection Time: 03/31/15  5:55 AM  Result Value Ref Range   Uric Acid, Serum 3.0 2.3 - 6.6 mg/dL  TSH     Status: None   Collection Time: 03/31/15  5:55 AM  Result Value Ref Range   TSH 1.065 0.350 - 4.500 uIU/mL  T4, free     Status: None   Collection Time: 03/31/15  5:55 AM  Result Value Ref Range   Free T4 0.63 0.61 - 1.12 ng/dL    FHT: BL FHR 161 w/ moderate variability, +accels, no decels UC:  regular, every 5 minutes SVE:   Dilation: 3.5 Effacement (%): 50 Station: -2 Exam by:: Thornell Mule, CNM Transcervical balloon out at ~ 1645 Pitocin at 2 mU/min AROM'd @ 2019, scant amount of blood tinged fluid  Assessment:  IOL due to preE w/o severe features BPs within normal parameters; no antihypertensives required - preE labs normal today Cat 1 FHRT H/O Goiter - normal TSH and free T4; remaining panel pending GBS neg LGA fetus  Plan: Continue induction Consult prn Vigilant for shoulder dystocia Expect progress and SVD   Sherre Scarlet CNM 03/31/2015, 8:27 PM

## 2015-03-31 NOTE — Progress Notes (Signed)
Subjective: Dancing in room to music with doula to "get things going". Coping well with contractions.   Objective: BP 128/75 mmHg  Pulse 97  Temp(Src) 98.5 F (36.9 C) (Oral)  Resp 16  Ht 5\' 6"  (1.676 m)  Wt 87.091 kg (192 lb)  BMI 31.00 kg/m2  LMP 06/25/2014      FHT: Category 1, Bl 125 bpm, moderate variability, +accels, no decels UC:   regular, every 2-4 minutes, mild  SVE:   Dilation: 1 Effacement (%): 50 Station: -3 Exam by:: K.Williams CNM Membranes intact S/p Cytotec x1 dose Foley Bulb placement at 0554 - still in place No pitocin  Assessment:  IOL due to preE w/o severe features Latent phase labor Foley Bulb in place  Cat 1 FHRT Normotensive  Plan: Gentle tension on foley bulb every hour- requested Rn to call CNM when foley bulb falls out Continue induction - Discussed pitocin infusion concurrent with foley bulb- R/B-  Pt desires to wait on pitocin until after next foley bulb check and reevaluate next method.  Consult prn Expect progress and SVD  Alphonzo Severanceachel Evah Rashid CNM, [place] 03/31/2015, 3:13 PM

## 2015-04-01 ENCOUNTER — Encounter (HOSPITAL_COMMUNITY): Payer: Self-pay | Admitting: *Deleted

## 2015-04-01 ENCOUNTER — Inpatient Hospital Stay (HOSPITAL_COMMUNITY): Payer: BC Managed Care – PPO | Admitting: Anesthesiology

## 2015-04-01 LAB — T3 UPTAKE: T3 Uptake Ratio: 18 % — ABNORMAL LOW (ref 24–39)

## 2015-04-01 LAB — CBC
HCT: 38.8 % (ref 36.0–46.0)
Hemoglobin: 13.3 g/dL (ref 12.0–15.0)
MCH: 29 pg (ref 26.0–34.0)
MCHC: 34.3 g/dL (ref 30.0–36.0)
MCV: 84.7 fL (ref 78.0–100.0)
Platelets: 204 10*3/uL (ref 150–400)
RBC: 4.58 MIL/uL (ref 3.87–5.11)
RDW: 14.8 % (ref 11.5–15.5)
WBC: 20.4 10*3/uL — ABNORMAL HIGH (ref 4.0–10.5)

## 2015-04-01 LAB — T4: T4, Total: 10.6 ug/dL (ref 4.5–12.0)

## 2015-04-01 MED ORDER — WITCH HAZEL-GLYCERIN EX PADS: 1.0000 "application " | MEDICATED_PAD | CUTANEOUS | Status: DC | PRN

## 2015-04-01 MED ORDER — LIDOCAINE HCL (PF) 1 % IJ SOLN
INTRAMUSCULAR | Status: DC | PRN
  Administered 2015-04-01: 3 mL via EPIDURAL
  Administered 2015-04-01: 5 mL via EPIDURAL
  Administered 2015-04-01: 2 mL via EPIDURAL

## 2015-04-01 MED ORDER — PRENATAL MULTIVITAMIN CH
1.0000 | ORAL_TABLET | Freq: Every day | ORAL | Status: DC
  Administered 2015-04-01 – 2015-04-02 (×2): 1 via ORAL
  Filled 2015-04-01 (×3): qty 1

## 2015-04-01 MED ORDER — SENNOSIDES-DOCUSATE SODIUM 8.6-50 MG PO TABS
2.0000 | ORAL_TABLET | ORAL | Status: DC
  Administered 2015-04-01 – 2015-04-03 (×2): 2 via ORAL
  Filled 2015-04-01 (×2): qty 2

## 2015-04-01 MED ORDER — ONDANSETRON HCL 4 MG PO TABS: 4.0000 mg | ORAL_TABLET | ORAL | Status: DC | PRN

## 2015-04-01 MED ORDER — PHENYLEPHRINE 40 MCG/ML (10ML) SYRINGE FOR IV PUSH (FOR BLOOD PRESSURE SUPPORT)
80.0000 ug | PREFILLED_SYRINGE | INTRAVENOUS | Status: DC | PRN
  Filled 2015-04-01: qty 2
  Filled 2015-04-01: qty 20

## 2015-04-01 MED ORDER — ONDANSETRON HCL 4 MG/2ML IJ SOLN
4.0000 mg | INTRAMUSCULAR | Status: DC | PRN
Start: 2015-04-01 — End: 2015-04-03

## 2015-04-01 MED ORDER — DIBUCAINE 1 % RE OINT: 1.0000 "application " | TOPICAL_OINTMENT | RECTAL | Status: DC | PRN

## 2015-04-01 MED ORDER — LANOLIN HYDROUS EX OINT: TOPICAL_OINTMENT | CUTANEOUS | Status: DC | PRN

## 2015-04-01 MED ORDER — EPHEDRINE 5 MG/ML INJ
10.0000 mg | INTRAVENOUS | Status: DC | PRN
Start: 2015-04-01 — End: 2015-04-01
  Filled 2015-04-01: qty 2

## 2015-04-01 MED ORDER — DIPHENHYDRAMINE HCL 50 MG/ML IJ SOLN: 12.5000 mg | INTRAMUSCULAR | Status: DC | PRN

## 2015-04-01 MED ORDER — ACETAMINOPHEN 325 MG PO TABS
650.0000 mg | ORAL_TABLET | ORAL | Status: DC | PRN
  Administered 2015-04-01: 650 mg via ORAL
  Filled 2015-04-01: qty 2

## 2015-04-01 MED ORDER — BENZOCAINE-MENTHOL 20-0.5 % EX AERO: 1.0000 "application " | INHALATION_SPRAY | CUTANEOUS | Status: DC | PRN

## 2015-04-01 MED ORDER — ZOLPIDEM TARTRATE 5 MG PO TABS: 5.0000 mg | ORAL_TABLET | Freq: Every evening | ORAL | Status: DC | PRN

## 2015-04-01 MED ORDER — TETANUS-DIPHTH-ACELL PERTUSSIS 5-2.5-18.5 LF-MCG/0.5 IM SUSP: 0.5000 mL | Freq: Once | INTRAMUSCULAR | Status: DC

## 2015-04-01 MED ORDER — DIPHENHYDRAMINE HCL 25 MG PO CAPS: 25.0000 mg | ORAL_CAPSULE | Freq: Four times a day (QID) | ORAL | Status: DC | PRN

## 2015-04-01 MED ORDER — FENTANYL 2.5 MCG/ML BUPIVACAINE 1/10 % EPIDURAL INFUSION (WH - ANES)
14.0000 mL/h | INTRAMUSCULAR | Status: DC | PRN
  Administered 2015-04-01: 14 mL/h via EPIDURAL
  Filled 2015-04-01: qty 125

## 2015-04-01 MED ORDER — OXYCODONE-ACETAMINOPHEN 5-325 MG PO TABS
1.0000 | ORAL_TABLET | ORAL | Status: DC | PRN
  Administered 2015-04-01 – 2015-04-02 (×2): 1 via ORAL
  Filled 2015-04-01 (×2): qty 1

## 2015-04-01 MED ORDER — SIMETHICONE 80 MG PO CHEW: 80.0000 mg | CHEWABLE_TABLET | ORAL | Status: DC | PRN

## 2015-04-01 MED ORDER — IBUPROFEN 600 MG PO TABS
600.0000 mg | ORAL_TABLET | Freq: Four times a day (QID) | ORAL | Status: DC
  Administered 2015-04-01 – 2015-04-03 (×8): 600 mg via ORAL
  Filled 2015-04-01 (×9): qty 1

## 2015-04-01 NOTE — Anesthesia Postprocedure Evaluation (Signed)
Anesthesia Post Note  Patient: Kaitlyn Todd  Procedure(s) Performed: * No procedures listed *  Patient location during evaluation: Mother Baby Anesthesia Type: Epidural Level of consciousness: awake Pain management: pain level controlled Vital Signs Assessment: post-procedure vital signs reviewed and stable Respiratory status: spontaneous breathing Cardiovascular status: stable Postop Assessment: no headache, no backache, epidural receding, patient able to bend at knees, no signs of nausea or vomiting and adequate PO intake Anesthetic complications: no    Last Vitals:  Filed Vitals:   04/01/15 0820 04/01/15 0920  BP: 115/71 114/62  Pulse: 91 90  Temp: 36.6 C 36.8 C  Resp: 18 18    Last Pain:  Filed Vitals:   04/01/15 1059  PainSc: 4                  Sparkle Aube

## 2015-04-01 NOTE — Progress Notes (Signed)
SOCIAL WORK MATERNAL/CHILD NOTE  Patient Details  Name: Kaitlyn Todd MRN: [id number] Date of Birth: 04/01/2015  Date: 04/01/2015  Clinical Social Worker Initiating Note: Cumi Bevel, LCSWDate/ Time Initiated: 04/01/15/1315   Child's Name: Kaitlyn Todd   Legal Guardian:  (Parents Carlia and David Sabala)   Need for Interpreter: None   Date of Referral: 03/31/15   Reason for Referral: Other (Comment)   Referral Source: [facility]   Address: [address] [place], [place] [address]  Phone number:  ([phone])   Household Members: Spouse, Minor Children   Natural Supports (not living in the home): Extended Family, Immediate Family   Professional Supports:Therapist   Employment:Full-time   Type of Work: School Teacher   Education:     Financial Resources:Private Insurance   Other Resources:     Cultural/Religious Considerations Which May Impact Care: none noted  Strengths: Ability to meet basic needs , Home prepared for child    Risk Factors/Current Problems: None   Cognitive State: Alert , Able to Concentrate    Mood/Affect: Calm , Happy    CSW Assessment: Acknowledged order for social work consult to assess mother's hx of anxiety. Met with mother who was pleasant and receptive to social work. She is married with one other dependent age 35. MOB reports hx of anxiety noted that "I'm type A personality and it goes with the territority. Informed that she does not take medication for the anxiety, but has a counselor that she uses when needed. She denies any current symptoms of depression or anxiety. Mother reports having an excellent support system. No acute social concerns noted or reported at this time. Mother informed of social work availability.  CSW Plan/Description:    She is aware of signs/symptoms of PP Depression and available resources No further intervention required No barriers  to discharge    Bevel, Cumi J, LCSW 04/01/2015, 2:23 PM 

## 2015-04-01 NOTE — Anesthesia Procedure Notes (Signed)
Epidural Patient location during procedure: OB  Staffing Anesthesiologist: Cecile HearingURK, Omarrion Carmer EDWARD Performed by: anesthesiologist   Preanesthetic Checklist Completed: patient identified, pre-op evaluation, timeout performed, IV checked, risks and benefits discussed and monitors and equipment checked  Epidural Patient position: sitting Prep: DuraPrep Patient monitoring: blood pressure and continuous pulse ox Approach: midline Location: L3-L4 Injection technique: LOR air  Needle:  Needle type: Tuohy  Needle gauge: 17 G Needle length: 9 cm Needle insertion depth: 5.5 cm Catheter size: 19 Gauge Catheter at skin depth: 11.5 cm Test dose: negative and Other (1% Lidocaine)  Additional Notes Patient identified.  Risk benefits discussed including failed block, incomplete pain control, headache, nerve damage, paralysis, blood pressure changes, nausea, vomiting, reactions to medication both toxic or allergic, and postpartum back pain.  Patient expressed understanding and wished to proceed.  All questions were answered.  Sterile technique used throughout procedure and epidural site dressed with sterile barrier dressing. No paresthesia or other complications noted. The patient did not experience any signs of intravascular injection such as tinnitus or metallic taste in mouth nor signs of intrathecal spread such as rapid motor block. Please see nursing notes for vital signs. Reason for block:procedure for pain

## 2015-04-01 NOTE — Discharge Instructions (Signed)
Postpartum Tubal Ligation Postpartum tubal ligation (PPTL) is a procedure that closes the fallopian tubes right after childbirth or 1-2 days after childbirth. PPTL is done before the uterus returns to its normal location. The procedure is also called a mini-laparotomy. When the fallopian tubes are closed, the eggs that are released from the ovaries cannot enter the uterus, and sperm cannot reach the egg. PPTL is done so you will not be able to get pregnant or have a baby. Although this procedure may be undone (reversed), it should be considered permanent and irreversible. If you want to have future pregnancies, you should not have this procedure. LET [facility] CARE PROVIDER KNOW ABOUT:  Any allergies you have.  All medicines you are taking, including vitamins, herbs, eye drops, creams, and over-the-counter medicines. This includes any use of steroids, either by mouth or in cream form.  Previous problems you or members of your family have had with the use of anesthetics.  Any blood disorders you have.  Previous surgeries you have had.  Any medical conditions you may have. RISKS AND COMPLICATIONS  Infection.  Bleeding.  Injury to surrounding organs.  Side effects from anesthetics.  Failure of the procedure.  Ectopic pregnancy.  Future regret about having the procedure done. BEFORE THE PROCEDURE  You may need to sign certain documents, including an informed consent form, up to 30 days before the date of your tubal ligation.  Follow instructions from your health care provider about eating and drinking restrictions. PROCEDURE  If done 1-2 days after a vaginal delivery:  You will be given one or more of the following:  A medicine that helps you relax (sedative).  A medicine that numbs the area (local anesthetic).  A medicine that makes you fall asleep (general anesthetic).  A medicine that is injected into an area of your body that numbs everything below the injection site  (regional anesthetic).  If you have been given general anesthetic, a tube will be put down your throat to help you breathe.  Your bladder may be emptied with a small tube (catheter).  A small cut (incision) will be made just above the pubic hair line.  The fallopian tubes will be located and brought up through the incision.  The fallopian tubes will be tied off or burned (cauterized), or they will be closed with a clamp, ring, or clip. In many cases, a small portion in the center of each fallopian tube will also be removed.  The incision will be closed with stitches (sutures).  A bandage (dressing) will be placed over the incision. The procedure may vary among health care providers and hospitals. If done after a cesarean delivery:  Tubal ligation will be done through the incision that was used for the cesarean delivery of your baby.  After the tubes are closed, the incision will be closed with stitches (sutures).  A bandage (dressing) will be placed over the incision. The procedure may vary among health care providers and hospitals. AFTER THE PROCEDURE  Your blood pressure, heart rate, breathing rate, and blood oxygen level will be monitored often until the medicines you were given have worn off.  You will be given pain medicine as needed.  If you had general anesthetic, you may have some mild discomfort in your throat. This is from the breathing tube that was placed in your throat while you were sleeping.  You may feel tired, and you should rest for the remainder of the day.  You may have some pain  or cramps in the abdominal area for 3-7 days.   This information is not intended to replace advice given to you by your health care provider. Make sure you discuss any questions you have with your health care provider.   Document Released: 03/03/2005 Document Revised: 07/18/2014 Document Reviewed: 06/14/2011 Elsevier Interactive Patient Education 2016 [employer]. Intrauterine  Device Information An intrauterine device (IUD) is inserted into your uterus to prevent pregnancy. There are two types of IUDs available:   Copper IUD--This type of IUD is wrapped in copper wire and is placed inside the uterus. Copper makes the uterus and fallopian tubes produce a fluid that kills sperm. The copper IUD can stay in place for 10 years.  Hormone IUD--This type of IUD contains the hormone progestin (synthetic progesterone). The hormone thickens the cervical mucus and prevents sperm from entering the uterus. It also thins the uterine lining to prevent implantation of a fertilized egg. The hormone can weaken or kill the sperm that get into the uterus. One type of hormone IUD can stay in place for 5 years, and another type can stay in place for 3 years. Your health care provider will make sure you are a good candidate for a contraceptive IUD. Discuss with your health care provider the possible side effects.  ADVANTAGES OF AN INTRAUTERINE DEVICE  IUDs are highly effective, reversible, long acting, and low maintenance.   There are no estrogen-related side effects.   An IUD can be used when breastfeeding.   IUDs are not associated with weight gain.   The copper IUD works immediately after insertion.   The hormone IUD works right away if inserted within 7 days of your period starting. You will need to use a backup method of birth control for 7 days if the hormone IUD is inserted at any other time in your cycle.  The copper IUD does not interfere with your female hormones.   The hormone IUD can make heavy menstrual periods lighter and decrease cramping.   The hormone IUD can be used for 3 or 5 years.   The copper IUD can be used for 10 years. DISADVANTAGES OF AN INTRAUTERINE DEVICE  The hormone IUD can be associated with irregular bleeding patterns.   The copper IUD can make your menstrual flow heavier and more painful.   You may experience cramping and vaginal  bleeding after insertion.    This information is not intended to replace advice given to you by your health care provider. Make sure you discuss any questions you have with your health care provider.   Document Released: 02/05/2004 Document Revised: 11/03/2012 Document Reviewed: 08/22/2012 Elsevier Interactive Patient Education [employer] Breastfeeding Deciding to breastfeed is one of the best choices you can make for you and your baby. A change in hormones during pregnancy causes your breast tissue to grow and increases the number and size of your milk ducts. These hormones also allow proteins, sugars, and fats from your blood supply to make breast milk in your milk-producing glands. Hormones prevent breast milk from being released before your baby is born as well as prompt milk flow after birth. Once breastfeeding has begun, thoughts of your baby, as well as his or her sucking or crying, can stimulate the release of milk from your milk-producing glands.  BENEFITS OF BREASTFEEDING For Your Baby  Your first milk (colostrum) helps your baby's digestive system function better.  There are antibodies in your milk that help your baby fight off infections.  Your  baby has a lower incidence of asthma, allergies, and sudden infant death syndrome.  The nutrients in breast milk are better for your baby than infant formulas and are designed uniquely for your baby's needs.  Breast milk improves your baby's brain development.  Your baby is less likely to develop other conditions, such as childhood obesity, asthma, or type 2 diabetes mellitus. For You  Breastfeeding helps to create a very special bond between you and your baby.  Breastfeeding is convenient. Breast milk is always available at the correct temperature and costs nothing.  Breastfeeding helps to burn calories and helps you lose the weight gained during pregnancy.  Breastfeeding makes your uterus contract to its prepregnancy size  faster and slows bleeding (lochia) after you give birth.   Breastfeeding helps to lower your risk of developing type 2 diabetes mellitus, osteoporosis, and breast or ovarian cancer later in life. SIGNS THAT YOUR BABY IS HUNGRY Early Signs of Hunger  Increased alertness or activity.  Stretching.  Movement of the head from side to side.  Movement of the head and opening of the mouth when the corner of the mouth or cheek is stroked (rooting).  Increased sucking sounds, smacking lips, cooing, sighing, or squeaking.  Hand-to-mouth movements.  Increased sucking of fingers or hands. Late Signs of Hunger  Fussing.  Intermittent crying. Extreme Signs of Hunger Signs of extreme hunger will require calming and consoling before your baby will be able to breastfeed successfully. Do not wait for the following signs of extreme hunger to occur before you initiate breastfeeding:  Restlessness.  A loud, strong cry.  Screaming. BREASTFEEDING BASICS Breastfeeding Initiation  Find a comfortable place to sit or lie down, with your neck and back well supported.  Place a pillow or rolled up blanket under your baby to bring him or her to the level of your breast (if you are seated). Nursing pillows are specially designed to help support your arms and your baby while you breastfeed.  Make sure that your baby's abdomen is facing your abdomen.  Gently massage your breast. With your fingertips, massage from your chest wall toward your nipple in a circular motion. This encourages milk flow. You may need to continue this action during the feeding if your milk flows slowly.  Support your breast with 4 fingers underneath and your thumb above your nipple. Make sure your fingers are well away from your nipple and your baby's mouth.  Stroke your baby's lips gently with your finger or nipple.  When your baby's mouth is open wide enough, quickly bring your baby to your breast, placing your entire nipple  and as much of the colored area around your nipple (areola) as possible into your baby's mouth.  More areola should be visible above your baby's upper lip than below the lower lip.  Your baby's tongue should be between his or her lower gum and your breast.  Ensure that your baby's mouth is correctly positioned around your nipple (latched). Your baby's lips should create a seal on your breast and be turned out (everted).  It is common for your baby to suck about 2-3 minutes in order to start the flow of breast milk. Latching Teaching your baby how to latch on to your breast properly is very important. An improper latch can cause nipple pain and decreased milk supply for you and poor weight gain in your baby. Also, if your baby is not latched onto your nipple properly, he or she may swallow some air during  feeding. This can make your baby fussy. Burping your baby when you switch breasts during the feeding can help to get rid of the air. However, teaching your baby to latch on properly is still the best way to prevent fussiness from swallowing air while breastfeeding. Signs that your baby has successfully latched on to your nipple:  Silent tugging or silent sucking, without causing you pain.  Swallowing heard between every 3-4 sucks.  Muscle movement above and in front of his or her ears while sucking. Signs that your baby has not successfully latched on to nipple:  Sucking sounds or smacking sounds from your baby while breastfeeding.  Nipple pain. If you think your baby has not latched on correctly, slip your finger into the corner of your baby's mouth to break the suction and place it between your baby's gums. Attempt breastfeeding initiation again. Signs of Successful Breastfeeding Signs from your baby:  A gradual decrease in the number of sucks or complete cessation of sucking.  Falling asleep.  Relaxation of his or her body.  Retention of a small amount of milk in his or her  mouth.  Letting go of your breast by himself or herself. Signs from you:  Breasts that have increased in firmness, weight, and size 1-3 hours after feeding.  Breasts that are softer immediately after breastfeeding.  Increased milk volume, as well as a change in milk consistency and color by the fifth day of breastfeeding.  Nipples that are not sore, cracked, or bleeding. Signs That Your Pecola Leisure is Getting Enough Milk  Wetting at least 3 diapers in a 24-hour period. The urine should be clear and pale yellow by age 69 days.  At least 3 stools in a 24-hour period by age 69 days. The stool should be soft and yellow.  At least 3 stools in a 24-hour period by age 561 days. The stool should be seedy and yellow.  No loss of weight greater than 10% of birth weight during the first 45 days of age.  Average weight gain of 4-7 ounces (113-198 g) per week after age 13 days.  Consistent daily weight gain by age 69 days, without weight loss after the age of 2 weeks. After a feeding, your baby may spit up a small amount. This is common. BREASTFEEDING FREQUENCY AND DURATION Frequent feeding will help you make more milk and can prevent sore nipples and breast engorgement. Breastfeed when you feel the need to reduce the fullness of your breasts or when your baby shows signs of hunger. This is called "breastfeeding on demand." Avoid introducing a pacifier to your baby while you are working to establish breastfeeding (the first 4-6 weeks after your baby is born). After this time you may choose to use a pacifier. Research has shown that pacifier use during the first year of a baby's life decreases the risk of sudden infant death syndrome (SIDS). Allow your baby to feed on each breast as long as he or she wants. Breastfeed until your baby is finished feeding. When your baby unlatches or falls asleep while feeding from the first breast, offer the second breast. Because newborns are often sleepy in the first few weeks of  life, you may need to awaken your baby to get him or her to feed. Breastfeeding times will vary from baby to baby. However, the following rules can serve as a guide to help you ensure that your baby is properly fed:  Newborns (babies 3 weeks of age or younger) may breastfeed  every 1-3 hours.  Newborns should not go longer than 3 hours during the day or 5 hours during the night without breastfeeding.  You should breastfeed your baby a minimum of 8 times in a 24-hour period until you begin to introduce solid foods to your baby at around 70 months of age. BREAST MILK PUMPING Pumping and storing breast milk allows you to ensure that your baby is exclusively fed your breast milk, even at times when you are unable to breastfeed. This is especially important if you are going back to work while you are still breastfeeding or when you are not able to be present during feedings. Your lactation consultant can give you guidelines on how long it is safe to store breast milk. A breast pump is a machine that allows you to pump milk from your breast into a sterile bottle. The pumped breast milk can then be stored in a refrigerator or freezer. Some breast pumps are operated by hand, while others use electricity. Ask your lactation consultant which type will work best for you. Breast pumps can be purchased, but some hospitals and breastfeeding support groups lease breast pumps on a monthly basis. A lactation consultant can teach you how to hand express breast milk, if you prefer not to use a pump. CARING FOR YOUR BREASTS WHILE YOU BREASTFEED Nipples can become dry, cracked, and sore while breastfeeding. The following recommendations can help keep your breasts moisturized and healthy:  Avoid using soap on your nipples.  Wear a supportive bra. Although not required, special nursing bras and tank tops are designed to allow access to your breasts for breastfeeding without taking off your entire bra or top. Avoid wearing  underwire-style bras or extremely tight bras.  Air dry your nipples for 3-93minutes after each feeding.  Use only cotton bra pads to absorb leaked breast milk. Leaking of breast milk between feedings is normal.  Use lanolin on your nipples after breastfeeding. Lanolin helps to maintain your skin's normal moisture barrier. If you use pure lanolin, you do not need to wash it off before feeding your baby again. Pure lanolin is not toxic to your baby. You may also hand express a few drops of breast milk and gently massage that milk into your nipples and allow the milk to air dry. In the first few weeks after giving birth, some women experience extremely full breasts (engorgement). Engorgement can make your breasts feel heavy, warm, and tender to the touch. Engorgement peaks within 3-5 days after you give birth. The following recommendations can help ease engorgement:  Completely empty your breasts while breastfeeding or pumping. You may want to start by applying warm, moist heat (in the shower or with warm water-soaked hand towels) just before feeding or pumping. This increases circulation and helps the milk flow. If your baby does not completely empty your breasts while breastfeeding, pump any extra milk after he or she is finished.  Wear a snug bra (nursing or regular) or tank top for 1-2 days to signal your body to slightly decrease milk production.  Apply ice packs to your breasts, unless this is too uncomfortable for you.  Make sure that your baby is latched on and positioned properly while breastfeeding. If engorgement persists after 48 hours of following these recommendations, contact your health care provider or a Advertising copywriter. OVERALL HEALTH CARE RECOMMENDATIONS WHILE BREASTFEEDING  Eat healthy foods. Alternate between meals and snacks, eating 3 of each per day. Because what you eat affects your breast milk, some  of the foods may make your baby more irritable than usual. Avoid eating  these foods if you are sure that they are negatively affecting your baby.  Drink milk, fruit juice, and water to satisfy your thirst (about 10 glasses a day).  Rest often, relax, and continue to take your prenatal vitamins to prevent fatigue, stress, and anemia.  Continue breast self-awareness checks.  Avoid chewing and smoking tobacco. Chemicals from cigarettes that pass into breast milk and exposure to secondhand smoke may harm your baby.  Avoid alcohol and drug use, including marijuana. Some medicines that may be harmful to your baby can pass through breast milk. It is important to ask your health care provider before taking any medicine, including all over-the-counter and prescription medicine as well as vitamin and herbal supplements. It is possible to become pregnant while breastfeeding. If birth control is desired, ask your health care provider about options that will be safe for your baby. SEEK MEDICAL CARE IF:  You feel like you want to stop breastfeeding or have become frustrated with breastfeeding.  You have painful breasts or nipples.  Your nipples are cracked or bleeding.  Your breasts are red, tender, or warm.  You have a swollen area on either breast.  You have a fever or chills.  You have nausea or vomiting.  You have drainage other than breast milk from your nipples.  Your breasts do not become full before feedings by the fifth day after you give birth.  You feel sad and depressed.  Your baby is too sleepy to eat well.  Your baby is having trouble sleeping.   Your baby is wetting less than 3 diapers in a 24-hour period.  Your baby has less than 3 stools in a 24-hour period.  Your baby's skin or the white part of his or her eyes becomes yellow.   Your baby is not gaining weight by 51 days of age. SEEK IMMEDIATE MEDICAL CARE IF:  Your baby is overly tired (lethargic) and does not want to wake up and feed.  Your baby develops an unexplained fever.     This information is not intended to replace advice given to you by your health care provider. Make sure you discuss any questions you have with your health care provider.   Document Released: 03/03/2005 Document Revised: 11/22/2014 Document Reviewed: 08/25/2012 Elsevier Interactive Patient Education 2016 [employer]. Postpartum Depression and Baby Blues The postpartum period begins right after the birth of a baby. During this time, there is often a great amount of joy and excitement. It is also a time of many changes in the life of the parents. Regardless of how many times a mother gives birth, each child brings new challenges and dynamics to the family. It is not unusual to have feelings of excitement along with confusing shifts in moods, emotions, and thoughts. All mothers are at risk of developing postpartum depression or the "baby blues." These mood changes can occur right after giving birth, or they may occur many months after giving birth. The baby blues or postpartum depression can be mild or severe. Additionally, postpartum depression can go away rather quickly, or it can be a long-term condition.  CAUSES Raised hormone levels and the rapid drop in those levels are thought to be a main cause of postpartum depression and the baby blues. A number of hormones change during and after pregnancy. Estrogen and progesterone usually decrease right after the delivery of your baby. The levels of thyroid hormone and various  cortisol steroids also rapidly drop. Other factors that play a role in these mood changes include major life events and genetics.  RISK FACTORS If you have any of the following risks for the baby blues or postpartum depression, know what symptoms to watch out for during the postpartum period. Risk factors that may increase the likelihood of getting the baby blues or postpartum depression include:  Having a personal or family history of depression.   Having depression while being  pregnant.   Having premenstrual mood issues or mood issues related to oral contraceptives.  Having a lot of life stress.   Having marital conflict.   Lacking a social support network.   Having a baby with special needs.   Having health problems, such as diabetes.  SIGNS AND SYMPTOMS Symptoms of baby blues include:  Brief changes in mood, such as going from extreme happiness to sadness.  Decreased concentration.   Difficulty sleeping.   Crying spells, tearfulness.   Irritability.   Anxiety.  Symptoms of postpartum depression typically begin within the first month after giving birth. These symptoms include:  Difficulty sleeping or excessive sleepiness.   Marked weight loss.   Agitation.   Feelings of worthlessness.   Lack of interest in activity or food.  Postpartum psychosis is a very serious condition and can be dangerous. Fortunately, it is rare. Displaying any of the following symptoms is cause for immediate medical attention. Symptoms of postpartum psychosis include:   Hallucinations and delusions.   Bizarre or disorganized behavior.   Confusion or disorientation.  DIAGNOSIS  A diagnosis is made by an evaluation of your symptoms. There are no medical or lab tests that lead to a diagnosis, but there are various questionnaires that a health care provider may use to identify those with the baby blues, postpartum depression, or psychosis. Often, a screening tool called the [place] Postnatal Depression Scale is used to diagnose depression in the postpartum period.  TREATMENT The baby blues usually goes away on its own in 1-2 weeks. Social support is often all that is needed. You will be encouraged to get adequate sleep and rest. Occasionally, you may be given medicines to help you sleep.  Postpartum depression requires treatment because it can last several months or longer if it is not treated. Treatment may include individual or group therapy,  medicine, or both to address any social, physiological, and psychological factors that may play a role in the depression. Regular exercise, a healthy diet, rest, and social support may also be strongly recommended.  Postpartum psychosis is more serious and needs treatment right away. Hospitalization is often needed. HOME CARE INSTRUCTIONS  Get as much rest as you can. Nap when the baby sleeps.   Exercise regularly. Some women find yoga and walking to be beneficial.   Eat a balanced and nourishing diet.   Do little things that you enjoy. Have a cup of tea, take a bubble bath, read your favorite magazine, or listen to your favorite music.  Avoid alcohol.   Ask for help with household chores, cooking, grocery shopping, or running errands as needed. Do not try to do everything.   Talk to people close to you about how you are feeling. Get support from your partner, family members, friends, or other new moms.  Try to stay positive in how you think. Think about the things you are grateful for.   Do not spend a lot of time alone.   Only take over-the-counter or prescription medicine as directed by  your health care provider.  Keep all your postpartum appointments.   Let your health care provider know if you have any concerns.  SEEK MEDICAL CARE IF: You are having a reaction to or problems with your medicine. SEEK IMMEDIATE MEDICAL CARE IF:  You have suicidal feelings.   You think you may harm the baby or someone else. MAKE SURE YOU:  Understand these instructions.  Will watch your condition.  Will get help right away if you are not doing well or get worse.   This information is not intended to replace advice given to you by your health care provider. Make sure you discuss any questions you have with your health care provider.   Document Released: 12/06/2003 Document Revised: 03/08/2013 Document Reviewed: 12/13/2012 Elsevier Interactive Patient Education 2016 Elsevier  Inc. Postpartum Care After Vaginal Delivery After you deliver your newborn (postpartum period), the usual stay in the hospital is 24-72 hours. If there were problems with your labor or delivery, or if you have other medical problems, you might be in the hospital longer.  While you are in the hospital, you will receive help and instructions on how to care for yourself and your newborn during the postpartum period.  While you are in the hospital:  Be sure to tell your nurses if you have pain or discomfort, as well as where you feel the pain and what makes the pain worse.  If you had an incision made near your vagina (episiotomy) or if you had some tearing during delivery, the nurses may put ice packs on your episiotomy or tear. The ice packs may help to reduce the pain and swelling.  If you are breastfeeding, you may feel uncomfortable contractions of your uterus for a couple of weeks. This is normal. The contractions help your uterus get back to normal size.  It is normal to have some bleeding after delivery.  For the first 1-3 days after delivery, the flow is red and the amount may be similar to a period.  It is common for the flow to start and stop.  In the first few days, you may pass some small clots. Let your nurses know if you begin to pass large clots or your flow increases.  Do not  flush blood clots down the toilet before having the nurse look at them.  During the next 3-10 days after delivery, your flow should become more watery and pink or brown-tinged in color.  Ten to fourteen days after delivery, your flow should be a small amount of yellowish-white discharge.  The amount of your flow will decrease over the first few weeks after delivery. Your flow may stop in 6-8 weeks. Most women have had their flow stop by 12 weeks after delivery.  You should change your sanitary pads frequently.  Wash your hands thoroughly with soap and water for at least 20 seconds after changing pads,  using the toilet, or before holding or feeding your newborn.  You should feel like you need to empty your bladder within the first 6-8 hours after delivery.  In case you become weak, lightheaded, or faint, call your nurse before you get out of bed for the first time and before you take a shower for the first time.  Within the first few days after delivery, your breasts may begin to feel tender and full. This is called engorgement. Breast tenderness usually goes away within 48-72 hours after engorgement occurs. You may also notice milk leaking from your breasts. If you  are not breastfeeding, do not stimulate your breasts. Breast stimulation can make your breasts produce more milk.  Spending as much time as possible with your newborn is very important. During this time, you and your newborn can feel close and get to know each other. Having your newborn stay in your room (rooming in) will help to strengthen the bond with your newborn. It will give you time to get to know your newborn and become comfortable caring for your newborn.  Your hormones change after delivery. Sometimes the hormone changes can temporarily cause you to feel sad or tearful. These feelings should not last more than a few days. If these feelings last longer than that, you should talk to your caregiver.  If desired, talk to your caregiver about methods of family planning or contraception.  Talk to your caregiver about immunizations. Your caregiver may want you to have the following immunizations before leaving the hospital:  Tetanus, diphtheria, and pertussis (Tdap) or tetanus and diphtheria (Td) immunization. It is very important that you and your family (including grandparents) or others caring for your newborn are up-to-date with the Tdap or Td immunizations. The Tdap or Td immunization can help protect your newborn from getting ill.  Rubella immunization.  Varicella (chickenpox) immunization.  Influenza immunization. You  should receive this annual immunization if you did not receive the immunization during your pregnancy.   This information is not intended to replace advice given to you by your health care provider. Make sure you discuss any questions you have with your health care provider.   Document Released: 12/29/2006 Document Revised: 11/26/2011 Document Reviewed: 10/29/2011 Elsevier Interactive Patient Education [employer]

## 2015-04-01 NOTE — Progress Notes (Addendum)
Cervical exam by this provider requested by pt.  Subjective: Feeling ctxs, which are becoming intense. Shaking. No preE sxs. Frustrated with lack of progress; fearful of c-section.  Objective: BP 128/79 mmHg  Pulse 96  Temp(Src) 98 F (36.7 C) (Oral)  Resp 16  Ht 5\' 6"  (1.676 m)  Wt 87.091 kg (192 lb)  BMI 31.00 kg/m2  LMP 06/25/2014   Today's Vitals   03/31/15 2202 03/31/15 2233 03/31/15 2234 04/01/15 0006  BP: 123/80  131/74 128/79  Pulse: 104  94 96  Temp: 97.9 F (36.6 C)   98 F (36.7 C)  TempSrc: Oral   Oral  Resp: 16     Height:      Weight:      PainSc: 4  7       FHT: BL 135 w/ moderate variability, intermittent mild variables to nadir 110 bpm, no lates UC:   irregular, every 2-3 minutes SVE:   Dilation: 4.5 Effacement (%): 60 Station: -2 Exam by:: Thornell MuleK. Darien Mignogna, CNM Pitocin at 12 mU/min Pelvis proven to 9+10 and feels adequate. Vtx feels higher than last exam, however well applied to cvx. Feels to be -3 station Continues to leak fluid  Assessment:  Latent phase of labor AROM since 2019 - no concerns for infection Normotensive Cat 2 FHRT that reverted to Cat 1 w/ intrauterine resuscitative measures LGA fetus GBS neg  Plan: Pt tearful and pessimistic after each cervical exam. Her thoughts since admission have been focused on her belief that her body is not ready to deliver, and she will ultimately deliver via c-section. Explained that she is being induced for a bonafide reason, and that she was correct in stating that her cvx was not "ready", hence the ripening agents (Misoprostol and Intracervical balloon). Informed that she is not yet in labor and explained how the latent phase of labor is by far the longest. Long conversation spent w/ pt and support persons re: expectations and ultimate end goal. I reminded the pt that the IOL was a recommendation in which she consented under no duress. I recommended she receive IV pain medication or an Epidural, mainly from  a rest perspective, since she has slept very little since admission. Informed she would need a great deal of energy upon entering the 2nd stage. She inquired whether an epidural would increase her chances for a c-section, and expressed fear that "things will slow down." Informed her that I was not aware of a negative relationship between an epidural and her needing a c-section. I explained that we will continue to increase the Pitocin as long as maternal/fetal well being remains reassuring. Also since admission, pt has spoken incessantly about the events of her first delivery. I have attempted to redirect her thoughts, informing her that all pregnancies/deliveries are different, and encouraged positive thoughts. She is not interested in pain medication at this time, but may consider later.   Continue intrauterine resuscitative measures prn. Consult prn. Continue induction.  Sherre ScarletWILLIAMS, Arliss Frisina CNM 04/01/2015, 12:10 AM

## 2015-04-01 NOTE — Anesthesia Preprocedure Evaluation (Signed)
Anesthesia Evaluation  Patient identified by MRN, date of birth, ID band Patient awake    Reviewed: Allergy & Precautions, NPO status , Patient's Chart, lab work & pertinent test results  Airway Mallampati: III  TM Distance: >3 FB Neck ROM: Full    Dental  (+) Teeth Intact, Dental Advisory Given   Pulmonary neg pulmonary ROS,    Pulmonary exam normal breath sounds clear to auscultation       Cardiovascular hypertension (PIH), negative cardio ROS Normal cardiovascular exam Rhythm:Regular Rate:Normal     Neuro/Psych PSYCHIATRIC DISORDERS Anxiety negative neurological ROS     GI/Hepatic negative GI ROS, Neg liver ROS,   Endo/Other  Obesity   Renal/GU negative Renal ROS     Musculoskeletal negative musculoskeletal ROS (+)   Abdominal   Peds  Hematology negative hematology ROS (+)   Anesthesia Other Findings Day of surgery medications reviewed with the patient.  Reproductive/Obstetrics (+) [facility]                             Anesthesia Physical Anesthesia Plan  ASA: II  Anesthesia Plan: Epidural   Post-op Pain Management:    Induction:   Airway Management Planned:   Additional Equipment:   Intra-op Plan:   Post-operative Plan:   Informed Consent: I have reviewed the patients History and Physical, chart, labs and discussed the procedure including the risks, benefits and alternatives for the proposed anesthesia with the patient or authorized representative who has indicated his/her understanding and acceptance.   Dental advisory given  Plan Discussed with:   Anesthesia Plan Comments: (Patient identified. Risks/Benefits/Options discussed with patient including but not limited to bleeding, infection, nerve damage, paralysis, failed block, incomplete pain control, headache, blood pressure changes, nausea, vomiting, reactions to medication both or allergic,  itching and postpartum back pain. Confirmed with bedside nurse the patient's most recent platelet count. Confirmed with patient that they are not currently taking any anticoagulation, have any bleeding history or any family history of bleeding disorders. Patient expressed understanding and wished to proceed. All questions were answered. )        Anesthesia Quick Evaluation

## 2015-04-01 NOTE — Progress Notes (Signed)
Subjective: Postpartum Day 0: Vaginal delivery, No laceration Patient up ad lib, reports no syncope or dizziness. Feeding:  Breastfeeding Contraceptive plan:  Undecided - considering paraguard, biltateral tubal ligation, or vasectomy  Reports no pain, "if I am laying still"    Objective: Vital signs in last 24 hours: Temp:  [97.6 F (36.4 C)-98.5 F (36.9 C)] 97.8 F (36.6 C) (01/15 0820) Pulse Rate:  [86-121] 91 (01/15 0820) Resp:  [16-18] 18 (01/15 0820) BP: (97-149)/(59-107) 115/71 mmHg (01/15 0820) SpO2:  [96 %-98 %] 97 % (01/15 0255)  Physical Exam:  General: alert and cooperative Lochia: appropriate Uterine Fundus: firm Perineum: healing well DVT Evaluation: No evidence of DVT seen on physical exam. Negative Homan's sign.   CBC Latest Ref Rng 04/01/2015 03/31/2015 03/30/2015  WBC 4.0 - 10.5 K/uL 20.4(H) 15.0(H) 13.2(H)  Hemoglobin 12.0 - 15.0 g/dL 16.113.3 09.613.1 04.513.2  Hematocrit 36.0 - 46.0 % 38.8 38.6 38.1  Platelets 150 - 400 K/uL 204 205 227     Assessment/Plan: Status post vaginal delivery day 0. Stable Continue current care. Breastfeeding Discussed pain management- recommended round the clock ibuprofen    Beatrix FettersRachel StallCNM 04/01/2015, 9:48 AM

## 2015-04-01 NOTE — Discharge Summary (Signed)
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 yo G2nowP2002 at 434w0das admitted to the hospital on 03/30/2015 for induction of labor. Indication for induction: Preeclampsia without severe features (presented with headache 3/10, elevated BP and office PCR of .47). She did not require IV antihypertensives or the use of Magnesium Sulfate, and her preE labs on HD #1 were normal. H/O Goiter - stable thyroid panel on HD #1 (unchanged from that on 02/14/15). She had an uncomplicated labor course and is as follows:  Intrapartum Procedures: Membrane Rupture Time/Date: 8:19 PM ,03/31/2015   Continuous EFM Labor epidural  Episiotomy: None [1]  Lacerations:  None [1]  Patient had delivery of a Viable infant "AnMitzi Hansen Information for the patient's newborn:  [place], Scotto0[[id number]]Delivery Method: Vaginal, Spontaneous Delivery (Filed from Delivery Summary)   Patient had an uncomplicated postpartum course and has met all milestones for discharge, i.e. ambulating, tolerating a regular diet, passing flatus, and urinating w/o difficulty. She was discharged home in stable condition on 1/  /17.   Today's Vitals   04/01/15 1958 04/01/15 2119 04/01/15 2337 04/02/15 0305  BP: 132/78     Pulse: 96     Temp: 98.6 F (37 C)     TempSrc: Oral     Resp: 16     Height:      Weight:      SpO2:      PainSc: _0 Asleep   Physical exam:   General: alert, cooperative and no distress Lochia: appropriate Uterine Fundus: firm Incision: NA - intact perineum/no lacs DVT Evaluation: No evidence of DVT seen on physical exam. Negative Homan's sign. No cords or calf tenderness. No significant calf/ankle edema. Labs: Lab Results  Component Value Date   WBC 20.4* 04/01/2015   HGB 13.3 04/01/2015   HCT 38.8 04/01/2015   MCV 84.7 04/01/2015   PLT 204 04/01/2015   CMP Latest Ref Rng 03/31/2015  Glucose 65 - 99 mg/dL 92  BUN 6 - 20 mg/dL 8  Creatinine 0.44 - 1.00 mg/dL 0.48  Sodium 135 - 145 mmol/L 137  Potassium 3.5 - 5.1 mmol/L 3.8  Chloride 101 - 111 mmol/L 110  CO2 22 - 32 mmol/L 19(L)  Calcium 8.9 - 10.3 mg/dL 8.8(L)  Total Protein 6.5 - 8.1 g/dL 6.0(L)  Total Bilirubin 0.3 - 1.2 mg/dL 0.6  Alkaline Phos 38 - 126 U/L 139(H)  AST 15 - 41 U/L 20  ALT 14 - 54 U/L 14    Results for CHISTINE, DEMATTEO (MRN [id number]) as of 04/01/2015 21:00  Ref. Range 02/19/2015 10:45 03/30/2015 20:49 03/30/2015 20:49 03/31/2015 05:55 04/01/2015 01:35  TSH Latest Ref Range: 0.350-4.500 uIU/mL    1.065   Free T4 Latest Ref Range: 0.61-1.12 ng/dL    0.63   T4, Total Latest Ref Range: 4.5-12.0 ug/dL    10.6   T3 Uptake Ratio Latest Ref Range: 24-39 %    18 (L)     Discharge instruction: per After Visit Summary and "Baby and Me Booklet".  Scheduled Meds: . ibuprofen  600 mg Oral 4 times per day  . prenatal multivitamin  1 tablet Oral Q1200  . [START ON 04/02/2015] senna-docusate  2 tablet Oral Q24H   PRN Meds:acetaminophen, benzocaine-Menthol, witch hazel-glycerin **AND** dibucaine, diphenhydrAMINE, lanolin, ondansetron **OR** ondansetron (ZOFRAN) IV, oxyCODONE-acetaminophen, simethicone,  zolpidem  Diet: routine diet  Activity: Advance as tolerated. Pelvic rest for 6 weeks.   Outpatient follow up:5 weeks if IUD, otherwise 6 weeks  Postpartum contraception: Undecided, but likely Copper T IUD, BTL or vasectomy  Newborn Data: Live born female  Birth Weight: 9 lb 9.8 oz (4360 g) APGARS: 8, 9 Inpatient circumcision  Baby Feeding: Breast Disposition:home with mother   04/02/15 Farrel Gordon, CNM

## 2015-04-01 NOTE — Lactation Note (Signed)
This note was copied from the chart of Kaitlyn Marcial PacasRuth Hayne. Lactation Consultation Note  Patient Name: Kaitlyn Todd ZOXWR'UToday's Date: 04/01/2015 Reason for consult: Initial assessment Baby at 10 hr of life and mom reports baby is latching well. She had a lot of problems getting a good latch and with supply with her 35 yr old. Mom's R nipple is everted (LC saw this side), L nipple she reports is flat (LC did not see this side). She brought a #24 NS from home to use on the L side only. Given Harmony to use on the L side after feedings when she uses the NS. Discussed baby behavior, feeding frequency, baby belly size, voids, wt loss, breast changes, NS, pumping, yeast symptoms, and nipple care. Baby was not cueing but mom tried to latch him with LC in the room. Mom did a great job with manual expression, breast compression, and positioning. Encouraged her to call when the baby goes to the L breast for the RN or LC to observe a feeding using the NS. Given lactation handouts. Aware of OP services and support group.      Maternal Data    Feeding Feeding Type: Breast Fed Length of feed: 0 min  LATCH Score/Interventions Latch: Too sleepy or reluctant, no latch achieved, no sucking elicited. Intervention(s): Skin to skin;Teach feeding cues;Waking techniques  Audible Swallowing: None Intervention(s): Skin to skin;Hand expression  Type of Nipple: Everted at rest and after stimulation  Comfort (Breast/Nipple): Soft / non-tender     Hold (Positioning): No assistance needed to correctly position infant at breast.  LATCH Score: 6  Lactation Tools Discussed/Used [facility]: No Pump Review: Setup, frequency, and cleaning;Milk Storage Initiated by:: ES Date initiated:: 04/01/15   Consult Status Consult Status: Follow-up Date: 04/02/15 Follow-up type: In-patient    Kaitlyn Todd 04/01/2015, 4:22 PM

## 2015-04-02 LAB — CBC
HCT: 32.2 % — ABNORMAL LOW (ref 36.0–46.0)
Hemoglobin: 10.8 g/dL — ABNORMAL LOW (ref 12.0–15.0)
MCH: 28.8 pg (ref 26.0–34.0)
MCHC: 33.5 g/dL (ref 30.0–36.0)
MCV: 85.9 fL (ref 78.0–100.0)
Platelets: 194 10*3/uL (ref 150–400)
RBC: 3.75 MIL/uL — ABNORMAL LOW (ref 3.87–5.11)
RDW: 15.1 % (ref 11.5–15.5)
WBC: 13.6 10*3/uL — ABNORMAL HIGH (ref 4.0–10.5)

## 2015-04-02 NOTE — Progress Notes (Addendum)
Subjective: Postpartum Day 1: Vaginal delivery, no laceration Patient up ad lib, reports no syncope or dizziness. Feeding:  Breast--baby having some feeding issues, but patient reports first good feeding this am. Contraceptive plan:  Considering Paragard, vasectomy, or BTL.  Seen by SW yesterday--no issues or barriers to d/c.  Objective: Vital signs in last 24 hours: Temp:  [97.4 F (36.3 C)-98.6 F (37 C)] 97.4 F (36.3 C) (01/16 0601) Pulse Rate:  [89-96] 89 (01/16 0601) Resp:  [16-18] 18 (01/16 0601) BP: (115-132)/(70-78) 122/73 mmHg (01/16 0601) SpO2:  [98 %-99 %] 98 % (01/16 0601)  Physical Exam:  General: alert Lochia: appropriate Uterine Fundus: firm Perineum: Clear DVT Evaluation: No evidence of DVT seen on physical exam. Negative Homan's sign.   CBC Latest Ref Rng 04/02/2015 04/01/2015 03/31/2015  WBC 4.0 - 10.5 K/uL 13.6(H) 20.4(H) 15.0(H)  Hemoglobin 12.0 - 15.0 g/dL 10.8(L) 13.3 13.1  Hematocrit 36.0 - 46.0 % 32.2(L) 38.8 38.6  Platelets 150 - 400 K/uL 194 204 205     Assessment/Plan: Status post vaginal delivery day 1. Stable Continue current care. Will determine candidacy for d/c based on baby's assessment by peds. If peds OK with d/c, will d/c patient.   Nyra CapesLATHAM, VICKICNM 04/02/2015, 10:21 AM  Addendum: In to check on patient status. Peds declines d/c today, due to baby just now having improved feedings. Plan d/c tomorrow, with circ tomorrow, as well, per peds preference.  Nigel BridgemanVicki Pamalee Marcoe, CNM 04/02/15 1p

## 2015-04-02 NOTE — Lactation Note (Signed)
This note was copied from the chart of Kaitlyn Marcial PacasRuth Dombkowski. Lactation Consultation Note: Experienced BF mom has baby latched to right breast when i went into room. Reports its is pinching some. Untucked bottom lip and mom reports that feels better. Dr Tommi Emerywisleton in to check baby. Would not latch after check- off to sleep. Reports baby nursed well after delivery but has been sleepy since. Reviewed normal behavior the first 24 hours and cluster feeding the second night. Baby for circ today- reviewed normal behavior after circ. Mom using NS on left breast- used it with first baby. Suggested trying without NS and see if he can latch on. No questions at present. To call for assist prn  Patient Name: Kaitlyn Marcial PacasRuth Biskup RUEAV'WToday's Date: 04/02/2015 Reason for consult: Follow-up assessment   Maternal Data Formula Feeding for Exclusion: No Has patient been taught Hand Expression?: Yes Does the patient have breastfeeding experience prior to this delivery?: Yes  Feeding Feeding Type: Breast Fed Length of feed: 3 min  LATCH Score/Interventions Latch: Grasps breast easily, tongue down, lips flanged, rhythmical sucking. Intervention(s): Skin to skin Intervention(s): Breast massage;Breast compression  Audible Swallowing: None Intervention(s): Skin to skin  Type of Nipple: Everted at rest and after stimulation  Comfort (Breast/Nipple): Soft / non-tender     Hold (Positioning): No assistance needed to correctly position infant at breast.  LATCH Score: 8  Lactation Tools Discussed/Used     Consult Status Consult Status: Follow-up Date: 04/03/15 Follow-up type: In-patient    Pamelia HoitWeeks, Brightyn Mozer D 04/02/2015, 9:15 AM

## 2015-04-03 ENCOUNTER — Encounter (HOSPITAL_COMMUNITY): Payer: Self-pay | Admitting: *Deleted

## 2015-04-03 MED ORDER — OXYCODONE-ACETAMINOPHEN 5-325 MG PO TABS: 1.0000 | ORAL_TABLET | ORAL | Status: DC | PRN

## 2015-04-03 MED ORDER — IBUPROFEN 800 MG PO TABS: 800.0000 mg | ORAL_TABLET | Freq: Three times a day (TID) | ORAL | Status: AC | PRN

## 2015-04-03 NOTE — Lactation Note (Signed)
This note was copied from the chart of Kaitlyn Todd. Lactation Consultation Note  Patient Name: Kaitlyn Todd YNWGN'F Date: 04/03/2015 Reason for consult: Follow-up assessment;Infant weight loss   Follow up with experienced BF mom of 50 hour old infant. Infant with 11 BF for 10-45 minutes, 2 BF attempts, 5 voids and 4 stools in last 24 hours. Mom reports hearing increased swallows today. LATCH Scores 7-9 by bedside RN. Infant currently in nursery for circumcision. Infant weight 8 lb 13.6 oz with an 8% weight loss since birth. Infant was sleepy the first 24 hours of life, feeding and output have picked up over the last 24 hours.  Mom reports nipple tenderness, she denies tissue breakdown or bruising to nipples, she is using EBM to nipples. Mom reports she is using a NS to left breast as nipple is flat, she is able to take it off after feeding started and nipple more erect and then infant will latch without it.  Mom used NS with 50 yo and is aware it should be used as a temporary tool to be weaned off asap and that weights need to be monitored closely. Mom has a DEBP at home for use, encouraged pumping if infant not latching well and due to NS use about 4 x a day to stimulate milk production and protect supply. Discussed all BF information in Taking Care of Baby and Me Booklet. Engorgement prevention/treatment discussed. I/O discussed and advised parents maintain I/O log and take to Ped. Offered mom OP appt, she wants to call later in week if OP appt needed based on weight and latching. Enc mom to call with questions/concerns, she is aware of LC phone #, Support Groups and OP Services.     Maternal Data Formula Feeding for Exclusion: No Has patient been taught Hand Expression?: Yes Does the patient have breastfeeding experience prior to this delivery?: Yes  Feeding Feeding Type: Breast Fed Length of feed: 15 min  LATCH Score/Interventions                       Lactation Tools Discussed/Used WIC Program: No Pump Review: Setup, frequency, and cleaning;Milk Storage   Consult Status Consult Status: Complete Follow-up type: Call as needed    Ed Blalock 04/03/2015, 8:18 AM

## 2015-04-17 ENCOUNTER — Ambulatory Visit (HOSPITAL_COMMUNITY): Payer: BC Managed Care – PPO

## 2015-06-26 ENCOUNTER — Other Ambulatory Visit: Payer: Self-pay | Admitting: Otolaryngology

## 2015-06-26 DIAGNOSIS — H9192 Unspecified hearing loss, left ear: Secondary | ICD-10-CM

## 2015-07-03 ENCOUNTER — Ambulatory Visit
Admission: RE | Admit: 2015-07-03 | Discharge: 2015-07-03 | Disposition: A | Discharge status: Home / Self Care | Payer: BC Managed Care – PPO | Source: Ambulatory Visit | Attending: Otolaryngology | Admitting: Otolaryngology

## 2015-07-03 DIAGNOSIS — H9192 Unspecified hearing loss, left ear: Secondary | ICD-10-CM

## 2015-07-03 MED ORDER — GADOBENATE DIMEGLUMINE 529 MG/ML IV SOLN
15.0000 mL | Freq: Once | INTRAVENOUS | Status: AC | PRN
  Administered 2015-07-03: 15 mL via INTRAVENOUS

## 2016-07-08 ENCOUNTER — Other Ambulatory Visit (HOSPITAL_COMMUNITY): Payer: Self-pay | Admitting: Internal Medicine

## 2016-07-08 DIAGNOSIS — E049 Nontoxic goiter, unspecified: Secondary | ICD-10-CM

## 2016-07-16 ENCOUNTER — Ambulatory Visit (HOSPITAL_COMMUNITY): Payer: BC Managed Care – PPO

## 2016-07-16 ENCOUNTER — Encounter (HOSPITAL_COMMUNITY): Payer: Self-pay

## 2016-07-17 ENCOUNTER — Encounter (HOSPITAL_COMMUNITY): Payer: BC Managed Care – PPO

## 2016-07-28 ENCOUNTER — Encounter (HOSPITAL_COMMUNITY)
Admission: RE | Admit: 2016-07-28 | Discharge: 2016-07-28 | Disposition: A | Discharge status: Home / Self Care | Payer: BC Managed Care – PPO | Source: Ambulatory Visit | Attending: Internal Medicine | Admitting: Internal Medicine

## 2016-07-28 DIAGNOSIS — E049 Nontoxic goiter, unspecified: Secondary | ICD-10-CM

## 2016-07-28 MED ORDER — SODIUM IODIDE I 131 CAPSULE: 7.7000 | Freq: Once | INTRAVENOUS | Status: DC | PRN

## 2016-07-29 ENCOUNTER — Encounter (HOSPITAL_COMMUNITY)
Admission: RE | Admit: 2016-07-29 | Discharge: 2016-07-29 | Disposition: A | Discharge status: Home / Self Care | Payer: BC Managed Care – PPO | Source: Ambulatory Visit | Attending: Internal Medicine | Admitting: Internal Medicine

## 2016-07-29 DIAGNOSIS — E049 Nontoxic goiter, unspecified: Secondary | ICD-10-CM | POA: Diagnosis present

## 2016-07-29 MED ORDER — SODIUM PERTECHNETATE TC 99M INJECTION
10.2000 | Freq: Once | INTRAVENOUS | Status: AC | PRN
  Administered 2016-07-29: 10.2 via INTRAVENOUS

## 2016-08-08 ENCOUNTER — Other Ambulatory Visit: Payer: Self-pay | Admitting: Internal Medicine

## 2016-08-08 ENCOUNTER — Ambulatory Visit
Admission: RE | Admit: 2016-08-08 | Discharge: 2016-08-08 | Disposition: A | Discharge status: Home / Self Care | Payer: BC Managed Care – PPO | Source: Ambulatory Visit | Attending: Internal Medicine | Admitting: Internal Medicine

## 2016-08-08 ENCOUNTER — Other Ambulatory Visit (HOSPITAL_COMMUNITY): Payer: Self-pay | Admitting: Respiratory Therapy

## 2016-08-08 DIAGNOSIS — R0602 Shortness of breath: Secondary | ICD-10-CM

## 2016-08-12 ENCOUNTER — Inpatient Hospital Stay (HOSPITAL_COMMUNITY): Admission: RE | Admit: 2016-08-12 | Payer: BC Managed Care – PPO | Source: Ambulatory Visit

## 2016-08-18 ENCOUNTER — Ambulatory Visit (HOSPITAL_COMMUNITY)
Admission: RE | Admit: 2016-08-18 | Discharge: 2016-08-18 | Disposition: A | Discharge status: Home / Self Care | Payer: BC Managed Care – PPO | Source: Ambulatory Visit | Attending: Internal Medicine | Admitting: Internal Medicine

## 2016-08-18 DIAGNOSIS — R0602 Shortness of breath: Secondary | ICD-10-CM | POA: Insufficient documentation

## 2016-08-18 MED ORDER — ALBUTEROL SULFATE (2.5 MG/3ML) 0.083% IN NEBU
2.5000 mg | INHALATION_SOLUTION | Freq: Once | RESPIRATORY_TRACT | Status: AC
  Administered 2016-08-18: 2.5 mg via RESPIRATORY_TRACT

## 2016-08-20 LAB — PULMONARY FUNCTION TEST
DL/VA % pred: 129 %
DL/VA: 6.52 ml/min/mmHg/L
DLCO unc % pred: 111 %
DLCO unc: 30 ml/min/mmHg
FEF 25-75 Post: 4 L/sec
FEF 25-75 Pre: 3.57 L/sec
FEF2575-%Change-Post: 12 %
FEF2575-%Pred-Post: 118 %
FEF2575-%Pred-Pre: 105 %
FEV1-%Change-Post: 2 %
FEV1-%Pred-Post: 89 %
FEV1-%Pred-Pre: 86 %
FEV1-Post: 2.94 L
FEV1-Pre: 2.86 L
FEV1FVC-%Change-Post: 3 %
FEV1FVC-%Pred-Pre: 103 %
FEV6-%Change-Post: 0 %
FEV6-%Pred-Post: 83 %
FEV6-%Pred-Pre: 84 %
FEV6-Post: 3.29 L
FEV6-Pre: 3.32 L
FEV6FVC-%Pred-Post: 102 %
FEV6FVC-%Pred-Pre: 102 %
FVC-%Change-Post: 0 %
FVC-%Pred-Post: 82 %
FVC-%Pred-Pre: 83 %
FVC-Post: 3.29 L
FVC-Pre: 3.32 L
Post FEV1/FVC ratio: 89 %
Post FEV6/FVC ratio: 100 %
Pre FEV1/FVC ratio: 86 %
Pre FEV6/FVC Ratio: 100 %
RV % pred: 95 %
RV: 1.53 L
TLC % pred: 94 %
TLC: 5.06 L

## 2017-12-28 ENCOUNTER — Encounter: Payer: Self-pay | Admitting: Emergency Medicine

## 2018-01-08 ENCOUNTER — Ambulatory Visit: Payer: BC Managed Care – PPO | Admitting: Physician Assistant

## 2018-01-08 ENCOUNTER — Encounter: Payer: Self-pay | Admitting: Physician Assistant

## 2018-01-08 DIAGNOSIS — F411 Generalized anxiety disorder: Secondary | ICD-10-CM | POA: Diagnosis not present

## 2018-01-08 MED ORDER — SERTRALINE HCL 25 MG PO TABS: 25.0000 mg | ORAL_TABLET | Freq: Every day | ORAL | 3 refills | Status: DC

## 2018-01-08 NOTE — Progress Notes (Signed)
[facility]  Patient ID: Kaitlyn Todd,  MRN: [id number]  PCP: Gweneth Dimitri, MD  Date of Evaluation: 01/08/2018 Time spent:15 minutes   HISTORY/CURRENT STATUS: HPI Here for annual med check.  Feels like she is doing pretty well but unsure if maybe the Zoloft needs to be increased.  She has been a little bit more edgy over the past month or so even to the point of getting irritated easily with her 45 and 37-year-old.  States she is never like that.  Also more tired than usual and motivation is lower.  She is able to enjoy things but the fatigue keeps her from it.  "It may just be that my husband is really busy with football season and not around so much to help out with usual family duties."  Not crying easily.  Not isolating.  She is going to work just fine and not missing days.  No palpitations, shortness of breath, sweats.  She does feel a little jittery inside at times though.  She sleeps about 7 to 7-1/2 hours but does not always feel rested when she wakes up.  States it is hard to know what is normal when you have kids they are ages.  Her older son has had a tough time transitioning to kindergarten although he is doing better now.  The beginning of the year, he would cry a lot, not want to go into class, which also caused anxiety for the patient.  It is not as bad now as it was initially.  Past medications for mental health diagnoses include: Celexa, Zoloft, trazodone  Individual Medical History/ Review of Systems: Changes? :No  Allergies: Latex and Adhesive [tape] Red dye can cause migraines but no rash, edema, etc.  Current Medications:  Current Outpatient Medications:  .  acetaminophen (TYLENOL) 500 MG tablet, Take 1,000 mg by mouth every 6 (six) hours as needed for moderate pain., Disp: , Rfl:  .  ibuprofen (ADVIL,MOTRIN) 800 MG tablet, Take 1 tablet (800 mg total) by mouth every 8 (eight) hours as needed., Disp: 50 tablet, Rfl: 1 .  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  MENTAL HEALTH EXAM:  There were no vitals taken for this visit.There is no height or weight on file to calculate BMI.  General Appearance: Well Groomed  Eye Contact:  Good  Speech:  Clear and Coherent  Volume:  Normal  Mood:  Euthymic  Affect:  Appropriate  Thought Process:  Goal Directed  Orientation:  Full (Time, Place, and Person)  Thought Content: Logical   Suicidal Thoughts:  No  Homicidal Thoughts:  No  Memory:  Immediate  Judgement:  Good  Insight:  Good  Psychomotor Activity:  Normal  Concentration:  Concentration: Good  Recall:  Good  Fund of Knowledge: Good  Language: Good  Akathisia:  No  AIMS (if indicated): not done  Assets:  Desire for Improvement  ADL's:  Intact  Cognition: WNL  Prognosis:  Good    DIAGNOSES:    ICD-10-CM   1. Generalized anxiety disorder F41.1     RECOMMENDATIONS: We discussed different options of either staying at Zoloft 25 mg or increase in the dose.  Because she is at such a low dose and the fact that there is possibility of poop out, I recommend we increase the Zoloft to  50 mg.  She should see at least some benefit within 4 to 6 weeks.  If she should feel like a "zombie" which is a concern, then she can decrease back to 25 mg if she is not really seeing any positive effects.  If the anxiety gets worse at any time however, let me know.  If she stays at the 50 mg, I would like to see her back in 6 to 8 weeks.  If she does not need to stay at that dose however, she can return in 1 year.   Melony Overly, PA-C

## 2018-02-23 ENCOUNTER — Ambulatory Visit: Payer: BC Managed Care – PPO | Admitting: Physician Assistant

## 2018-03-26 ENCOUNTER — Ambulatory Visit: Payer: BC Managed Care – PPO | Admitting: Physician Assistant

## 2018-04-15 ENCOUNTER — Encounter: Payer: Self-pay | Admitting: Physician Assistant

## 2018-04-15 ENCOUNTER — Ambulatory Visit: Payer: BC Managed Care – PPO | Admitting: Physician Assistant

## 2018-04-15 DIAGNOSIS — F411 Generalized anxiety disorder: Secondary | ICD-10-CM | POA: Diagnosis not present

## 2018-04-15 MED ORDER — SERTRALINE HCL 25 MG PO TABS: 37.5000 mg | ORAL_TABLET | Freq: Every day | ORAL | 3 refills | Status: DC

## 2018-04-15 NOTE — Progress Notes (Signed)
[facility]  Patient ID: Kaitlyn Todd,  MRN: [id number]  PCP: Gweneth Dimitri, MD  Date of Evaluation: 04/15/2018 Time spent:15 minutes  Chief Complaint:  Chief Complaint    Follow-up      HISTORY/CURRENT STATUS: HPI here for routine med check.  She increased the Zoloft as we had discussed in the past.  She was having more anxiety so went to 50 mg.  At that dose though she felt very flat and without any emotion at all.  She went down to 1-1/2 pills to equal 37.5 mg.  She is done fine with that and the anxiety is well controlled.  She will get a little anxious if there is a trigger but otherwise not.   Patient denies loss of interest in usual activities and is able to enjoy things.  Denies decreased energy or motivation.  Appetite has not changed.  No extreme sadness, tearfulness, or feelings of hopelessness.  Denies any changes in concentration, making decisions or remembering things.  Denies suicidal or homicidal thoughts.  Work is going well.  She sleeps fine.  Individual Medical History/ Review of Systems: Changes? :No    Past medications for mental health diagnoses include: Celexa  Allergies: Latex and Adhesive [tape]  Current Medications:  Current Outpatient Medications:  .  acetaminophen (TYLENOL) 500 MG tablet, Take 1,000 mg by mouth every 6 (six) hours as needed for moderate pain., Disp: , Rfl:  .  ibuprofen (ADVIL,MOTRIN) 800 MG tablet, Take 1 tablet (800 mg total) by mouth every 8 (eight) hours as needed., Disp: 50 tablet, Rfl: 1 .  sertraline (ZOLOFT) 25 MG tablet, Take 1.5 tablets (37.5 mg total) by mouth daily., Disp: 135 tablet, Rfl: 3 Medication Side Effects: none  Family Medical/ Social History: Changes? Yes husband quit his job is now looking for another 1.  MENTAL HEALTH EXAM:  There were no vitals taken for this visit.There is no height or weight on file to calculate BMI.  General Appearance: Casual and Well Groomed  Eye Contact:  Good   Speech:  Clear and Coherent  Volume:  Normal  Mood:  Euthymic  Affect:  Appropriate  Thought Process:  Goal Directed  Orientation:  Full (Time, Place, and Person)  Thought Content: Logical   Suicidal Thoughts:  No  Homicidal Thoughts:  No  Memory:  WNL  Judgement:  Good  Insight:  Good  Psychomotor Activity:  Normal  Concentration:  Concentration: Good  Recall:  Good  Fund of Knowledge: Good  Language: Good  Assets:  Desire for Improvement  ADL's:  Intact  Cognition: WNL  Prognosis:  Good    DIAGNOSES:    ICD-10-CM   1. Generalized anxiety disorder F41.1     Receiving Psychotherapy: No    RECOMMENDATIONS: Continue Zoloft 25 mg, 1.5 pills daily to equal 37.5 mg daily. Return in 1 year or sooner as needed.   Melony Overly, PA-C

## 2018-06-23 ENCOUNTER — Telehealth: Payer: BC Managed Care – PPO | Admitting: Family

## 2018-06-23 DIAGNOSIS — R05 Cough: Secondary | ICD-10-CM

## 2018-06-23 DIAGNOSIS — Z7189 Other specified counseling: Secondary | ICD-10-CM

## 2018-06-23 DIAGNOSIS — R059 Cough, unspecified: Secondary | ICD-10-CM

## 2018-06-23 DIAGNOSIS — J301 Allergic rhinitis due to pollen: Secondary | ICD-10-CM

## 2018-06-23 MED ORDER — ALBUTEROL SULFATE HFA 108 (90 BASE) MCG/ACT IN AERS: 2.0000 | INHALATION_SPRAY | Freq: Four times a day (QID) | RESPIRATORY_TRACT | 0 refills | Status: DC | PRN

## 2018-06-23 MED ORDER — BENZONATATE 100 MG PO CAPS: 100.0000 mg | ORAL_CAPSULE | Freq: Three times a day (TID) | ORAL | 0 refills | Status: DC | PRN

## 2018-06-23 NOTE — Progress Notes (Signed)
E-Visit for Corona Virus Screening  Based on your current symptoms, it seems unlikely that your symptoms are related to the Coronavirus.  I would start zyrtec daily and continue the flonase. It would be best to quarantine for 14 days and I would avoid your in-laws   Approximately 5 minutes was spent documenting and reviewing patient's chart.    Coronavirus disease 2019 (COVID-19) is a respiratory illness that can spread from person to person. The virus that causes COVID-19 is a new virus that was first identified in the country of [place] but is now found in multiple other countries and has spread to the [place].  Symptoms associated with the virus are mild to severe fever, cough, and shortness of breath. There is currently no vaccine to protect against COVID-19, and there is no specific antiviral treatment for the virus.   To be considered HIGH RISK for Coronavirus (COVID-19), you have to meet the following criteria:  . Traveled to [place], [place], [place], [place] or [place]; or in the [place] to [place], [place], [place], or [place]; and have fever, cough, and shortness of breath within the last 2 weeks of travel OR  . Been in close contact with a person diagnosed with COVID-19 within the last 2 weeks and have fever, cough, and shortness of breath  . IF YOU DO NOT MEET THESE CRITERIA, YOU ARE CONSIDERED LOW RISK FOR COVID-19.   It is vitally important that if you feel that you have an infection such as this virus or any other virus that you stay home and away from places where you may spread it to others.  You should self-quarantine for 14 days if you have symptoms that could potentially be coronavirus and avoid contact with people age 38 and older.   You can use medication such as A prescription cough medication called Tessalon Perles 100 mg. You may take 1-2 capsules every 8 hours as needed for cough and A prescription inhaler called Albuterol MDI 90 mcg /actuation 2 puffs every  4 hours as needed for shortness of breath, wheezing, cough.  You may also take acetaminophen (Tylenol) as needed for fever.   Reduce your risk of any infection by using the same precautions used for avoiding the common cold or flu:  Marland Kitchen. Wash your hands often with soap and warm water for at least 20 seconds.  If soap and water are not readily available, use an alcohol-based hand sanitizer with at least 60% alcohol.  . If coughing or sneezing, cover your mouth and nose by coughing or sneezing into the elbow areas of your shirt or coat, into a tissue or into your sleeve (not your hands). . Avoid shaking hands with others and consider head nods or verbal greetings only. . Avoid touching your eyes, nose, or mouth with unwashed hands.  . Avoid close contact with people who are sick. . Avoid places or events with large numbers of people in one location, like concerts or sporting events. . Carefully consider travel plans you have or are making. . If you are planning any travel outside or inside the [place], visit the CDC's Travelers' Health webpage for the latest health notices. . If you have some symptoms but not all symptoms, continue to monitor at home and seek medical attention if your symptoms worsen. . If you are having a medical emergency, call 911.  HOME CARE . Only take medications as instructed by your medical team. . Drink plenty of fluids and get  plenty of rest. . A steam or ultrasonic humidifier can help if you have congestion.   GET HELP RIGHT AWAY IF: . You develop worsening fever. . You become short of breath . You cough up blood. . Your symptoms become more severe MAKE SURE YOU   Understand these instructions.  Will watch your condition.  Will get help right away if you are not doing well or get worse.  Your e-visit answers were reviewed by a board certified advanced clinical practitioner to complete your personal care plan.  Depending on the condition, your plan could have  included both over the counter or prescription medications.  If there is a problem please reply once you have received a response from your provider. Your safety is important to [place].  If you have drug allergies check your prescription carefully.    You can use MyChart to ask questions about today's visit, request a non-urgent call back, or ask for a work or school excuse for 24 hours related to this e-Visit. If it has been greater than 24 hours you will need to follow up with your provider, or enter a new e-Visit to address those concerns. You will get an e-mail in the next two days asking about your experience.  I hope that your e-visit has been valuable and will speed your recovery. Thank you for using e-visits.

## 2018-07-24 ENCOUNTER — Other Ambulatory Visit: Payer: Self-pay | Admitting: Family

## 2018-07-24 DIAGNOSIS — R059 Cough, unspecified: Secondary | ICD-10-CM

## 2018-07-24 DIAGNOSIS — Z7189 Other specified counseling: Secondary | ICD-10-CM

## 2018-07-24 DIAGNOSIS — R05 Cough: Secondary | ICD-10-CM

## 2018-07-24 DIAGNOSIS — J301 Allergic rhinitis due to pollen: Secondary | ICD-10-CM

## 2018-11-18 ENCOUNTER — Other Ambulatory Visit: Payer: Self-pay

## 2018-11-18 DIAGNOSIS — Z20822 Contact with and (suspected) exposure to covid-19: Secondary | ICD-10-CM

## 2018-11-19 LAB — NOVEL CORONAVIRUS, NAA: SARS-CoV-2, NAA: NOT DETECTED

## 2019-02-18 ENCOUNTER — Other Ambulatory Visit: Payer: Self-pay

## 2019-02-18 DIAGNOSIS — Z20822 Contact with and (suspected) exposure to covid-19: Secondary | ICD-10-CM

## 2019-02-20 ENCOUNTER — Telehealth: Payer: Self-pay | Admitting: *Deleted

## 2019-02-20 NOTE — Telephone Encounter (Signed)
Patient called for results ,still pending . 

## 2019-02-21 LAB — NOVEL CORONAVIRUS, NAA: SARS-CoV-2, NAA: NOT DETECTED

## 2019-04-18 ENCOUNTER — Encounter: Payer: Self-pay | Admitting: Physician Assistant

## 2019-04-18 ENCOUNTER — Other Ambulatory Visit: Payer: Self-pay

## 2019-04-18 ENCOUNTER — Ambulatory Visit (INDEPENDENT_AMBULATORY_CARE_PROVIDER_SITE_OTHER): Payer: BC Managed Care – PPO | Admitting: Physician Assistant

## 2019-04-18 DIAGNOSIS — F411 Generalized anxiety disorder: Secondary | ICD-10-CM

## 2019-04-18 MED ORDER — SERTRALINE HCL 25 MG PO TABS: 37.5000 mg | ORAL_TABLET | Freq: Every day | ORAL | 3 refills | Status: DC

## 2019-04-18 NOTE — Progress Notes (Signed)
[facility]  Patient ID: Kaitlyn Todd,  MRN: [id number]  PCP: Gweneth Dimitri, MD  Date of Evaluation: 04/18/2019 Time spent:20 minutes  Chief Complaint:  Chief Complaint    Anxiety      HISTORY/CURRENT STATUS: HPI For annual med check.   States she is doing well under the circumstances.  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  She is able to work from home 3 days a week.  She does have anxiety at times, she can tell that she gets angry and irritable, more edgy when she does not take the Zoloft.  She has accidentally missed it for a couple of days in a row in the past and has noticed those symptoms recurring.  The anxiety is more of a generalized sense of something bad that is about to happen, not anxiety attacks.  Patient denies loss of interest in usual activities and is able to enjoy things.  Denies decreased energy or motivation.  Appetite has not changed.  No extreme sadness, tearfulness, or feelings of hopelessness.  Denies any changes in concentration, making decisions or remembering things.  Denies suicidal or homicidal thoughts.  Patient denies increased energy with decreased need for sleep, no increased talkativeness, no racing thoughts, no impulsivity or risky behaviors, no increased spending, no increased libido, no grandiosity.  Denies dizziness, syncope, seizures, numbness, tingling, tremor, tics, unsteady gait, slurred speech, confusion. Denies muscle or joint pain, stiffness, or dystonia.  Individual Medical History/ Review of Systems: Changes? :No    Past medications for mental health diagnoses include: Celexa  Allergies: Latex and Adhesive [tape]  Current Medications:  Current Outpatient Medications:  .  acetaminophen (TYLENOL) 500 MG tablet, Take 1,000 mg by mouth every 6 (six) hours as needed for moderate pain., Disp: , Rfl:  .  ibuprofen (ADVIL,MOTRIN) 800 MG tablet, Take 1  tablet (800 mg total) by mouth every 8 (eight) hours as needed., Disp: 50 tablet, Rfl: 1 .  sertraline (ZOLOFT) 25 MG tablet, Take 1.5 tablets (37.5 mg total) by mouth daily., Disp: 135 tablet, Rfl: 3 .  albuterol (PROVENTIL HFA;VENTOLIN HFA) 108 (90 Base) MCG/ACT inhaler, Inhale 2 puffs into the lungs every 6 (six) hours as needed for wheezing or shortness of breath. (Patient not taking: Reported on 04/18/2019), Disp: 1 Inhaler, Rfl: 0 .  benzonatate (TESSALON PERLES) 100 MG capsule, Take 1 capsule (100 mg total) by mouth 3 (three) times daily as needed. (Patient not taking: Reported on 04/18/2019), Disp: 20 capsule, Rfl: 0 .  ondansetron (ZOFRAN-ODT) 4 MG disintegrating tablet, ondansetron 4 mg disintegrating tablet  PLACE 1 TABLET ON THE TONGUE AND ALLOW TO DISSOLVE AS NEEDED EVERY 4 HRS, Disp: , Rfl:  .  SUMAtriptan Succinate (IMITREX PO), Imitrex, Disp: , Rfl:  Medication Side Effects: none  Family Medical/ Social History: Changes? Yes work has been challenging.  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  "We will see where it goes."  MENTAL HEALTH EXAM:  There were no vitals taken for this visit.There is no height or weight on file to calculate BMI.  General Appearance: Casual, Neat and Well Groomed  Eye Contact:  Good  Speech:  Clear and Coherent  Volume:  Normal  Mood:  Euthymic  Affect:  Appropriate  Thought Process:  Goal Directed  Orientation:  Full (Time, Place, and Person)  Thought Content: Logical   Suicidal Thoughts:  No  Homicidal Thoughts:  No  Memory:  WNL  Judgement:  Good  Insight:  Good  Psychomotor Activity:  Normal  Concentration:  Concentration: Good  Recall:  Good  Fund of Knowledge: Good  Language: Good  Assets:  Desire for Improvement  ADL's:  Intact  Cognition: WNL  Prognosis:  Good    DIAGNOSES:    ICD-10-CM   1. Generalized anxiety disorder  F41.1     Receiving  Psychotherapy: No Has not needed to see a counselor in quite a while.   RECOMMENDATIONS:  I spent 20 minutes with her. We discussed different coping skills. Continue Zoloft 25 mg, 1-1/2 pills daily (37.5 mg.) Return in 1 year.  Donnal Moat, PA-C

## 2019-04-30 ENCOUNTER — Ambulatory Visit: Payer: BC Managed Care – PPO

## 2019-05-14 ENCOUNTER — Ambulatory Visit: Payer: BC Managed Care – PPO

## 2019-08-31 ENCOUNTER — Other Ambulatory Visit: Payer: Self-pay | Admitting: *Deleted

## 2019-08-31 DIAGNOSIS — J301 Allergic rhinitis due to pollen: Secondary | ICD-10-CM

## 2019-08-31 DIAGNOSIS — Z7189 Other specified counseling: Secondary | ICD-10-CM

## 2019-08-31 DIAGNOSIS — R059 Cough, unspecified: Secondary | ICD-10-CM

## 2019-09-01 ENCOUNTER — Other Ambulatory Visit: Payer: Self-pay | Admitting: Family

## 2019-09-01 DIAGNOSIS — J301 Allergic rhinitis due to pollen: Secondary | ICD-10-CM

## 2019-09-01 DIAGNOSIS — Z7189 Other specified counseling: Secondary | ICD-10-CM

## 2019-09-01 DIAGNOSIS — R059 Cough, unspecified: Secondary | ICD-10-CM

## 2019-09-27 ENCOUNTER — Ambulatory Visit (INDEPENDENT_AMBULATORY_CARE_PROVIDER_SITE_OTHER): Payer: BC Managed Care – PPO | Admitting: Physician Assistant

## 2019-09-27 ENCOUNTER — Encounter: Payer: Self-pay | Admitting: Physician Assistant

## 2019-09-27 ENCOUNTER — Other Ambulatory Visit: Payer: Self-pay

## 2019-09-27 DIAGNOSIS — E559 Vitamin D deficiency, unspecified: Secondary | ICD-10-CM | POA: Diagnosis not present

## 2019-09-27 DIAGNOSIS — N943 Premenstrual tension syndrome: Secondary | ICD-10-CM

## 2019-09-27 DIAGNOSIS — F411 Generalized anxiety disorder: Secondary | ICD-10-CM

## 2019-09-27 DIAGNOSIS — G4484 Primary exertional headache: Secondary | ICD-10-CM

## 2019-09-27 DIAGNOSIS — F320 Major depressive disorder, single episode, mild: Secondary | ICD-10-CM

## 2019-09-27 MED ORDER — SERTRALINE HCL 50 MG PO TABS
50.0000 mg | ORAL_TABLET | Freq: Every day | ORAL | 1 refills | Status: DC
Start: 2019-09-27 — End: 2019-10-20

## 2019-09-27 NOTE — Progress Notes (Signed)
[facility]  Patient ID: Kaitlyn Todd,  MRN: [id number]  PCP: Gweneth Dimitri, MD  Date of Evaluation: 09/27/2019 Time spent:30 minutes  Chief Complaint:  Chief Complaint    Medication Refill      HISTORY/CURRENT STATUS: HPI For annual med check.   For the past 2 weeks or so, has been more irritable, not wanting to get out of bed although she does, feels almost like she did when she had postpartum depression with her youngest son.  Also feels a little like PMS.  Usually that only will last 2 days prior to menses beginning.  States she is doing things and enjoys them.  She is going to the gym regularly and has lost about 10 pounds on weight watchers.  She has noticed that she gets headaches often times about 6 hours after she exercises.  Not like a migraine which she has had in the past, but more of a generalized achiness, not associated with any neurologic deficits.  She is drinking a lot of fluids.  Unsure if that is related to her mood or not.  Not quite as motivated as she used to be.  Not crying easily.  Not having anxiety to speak of.  Usually sleeps okay but last night she did not sleep well because she needed to be up with her children.  No suicidal or homicidal thoughts.  Patient denies increased energy with decreased need for sleep, no increased talkativeness, no racing thoughts, no impulsivity or risky behaviors, no increased spending, no increased libido, no grandiosity, no paranoia, she does have irritability as noted above.  Denies dizziness, syncope, seizures, numbness, tingling, tremor, tics, unsteady gait, slurred speech, confusion. Denies muscle or joint pain, stiffness, or dystonia.  Individual Medical History/ Review of Systems: Changes? :Yes Had low vitamin D discovered by her GYN.  She took 50,000 IUs for a couple of months and her level increased.  She is now taking a maintenance dose daily.  Past medications for mental health diagnoses  include: Celexa  Allergies: Latex and Adhesive [tape]  Current Medications:  Current Outpatient Medications:  .  acetaminophen (TYLENOL) 500 MG tablet, Take 1,000 mg by mouth every 6 (six) hours as needed for moderate pain., Disp: , Rfl:  .  cholecalciferol (VITAMIN D3) 25 MCG (1000 UNIT) tablet, Take 5,000 Units by mouth daily., Disp: , Rfl:  .  ibuprofen (ADVIL,MOTRIN) 800 MG tablet, Take 1 tablet (800 mg total) by mouth every 8 (eight) hours as needed., Disp: 50 tablet, Rfl: 1 .  ondansetron (ZOFRAN-ODT) 4 MG disintegrating tablet, ondansetron 4 mg disintegrating tablet  PLACE 1 TABLET ON THE TONGUE AND ALLOW TO DISSOLVE AS NEEDED EVERY 4 HRS, Disp: , Rfl:  .  SUMAtriptan Succinate (IMITREX PO), Imitrex, Disp: , Rfl:  .  albuterol (PROVENTIL HFA;VENTOLIN HFA) 108 (90 Base) MCG/ACT inhaler, Inhale 2 puffs into the lungs every 6 (six) hours as needed for wheezing or shortness of breath. (Patient not taking: Reported on 04/18/2019), Disp: 1 Inhaler, Rfl: 0 .  benzonatate (TESSALON PERLES) 100 MG capsule, Take 1 capsule (100 mg total) by mouth 3 (three) times daily as needed. (Patient not taking: Reported on 04/18/2019), Disp: 20 capsule, Rfl: 0 .  sertraline (ZOLOFT) 50 MG tablet, Take 1 tablet (50 mg total) by mouth daily., Disp: 30 tablet, Rfl: 1 Medication Side Effects: none  Family Medical/ Social History: Changes?  No  MENTAL HEALTH EXAM:  There were no vitals taken for this visit.There is no height or weight on  file to calculate BMI.  General Appearance: Casual, Neat and Well Groomed  Eye Contact:  Good  Speech:  Clear and Coherent and Normal Rate  Volume:  Normal  Mood:  Euthymic  Affect:  Appropriate  Thought Process:  Goal Directed and Descriptions of Associations: Intact  Orientation:  Full (Time, Place, and Person)  Thought Content: Logical   Suicidal Thoughts:  No  Homicidal Thoughts:  No  Memory:  WNL  Judgement:  Good  Insight:  Good  Psychomotor Activity:  Normal   Concentration:  Concentration: Good and Attention Span: Good  Recall:  Good  Fund of Knowledge: Good  Language: Good  Assets:  Desire for Improvement  ADL's:  Intact  Cognition: WNL  Prognosis:  Good    DIAGNOSES:    ICD-10-CM   1. Generalized anxiety disorder  F41.1   2. PMS (premenstrual syndrome)  N94.3   3. Current mild episode of major depressive disorder, unspecified whether recurrent (HCC)  F32.0   4. 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 I provided 30 minutes of face-to-face time during this encounter. We discussed PMS, symptoms of mild depression, there is a recent diagnosis and treatment of vitamin D deficiency, headache that sounds exertional.  I think increasing the Zoloft would be beneficial.  As far as the PMS/PMDD is concerned, we will need to increase the dose slightly a week or so before menses, but right now we decided to leave the dose continuous for the whole month and readdress the PMS issue at the next visit. Increase Zoloft to 50 mg, 1 p.o. daily. Return in 4 weeks.  Melony Overly, PA-C

## 2019-10-19 ENCOUNTER — Other Ambulatory Visit: Payer: Self-pay | Admitting: Physician Assistant

## 2019-10-25 ENCOUNTER — Other Ambulatory Visit: Payer: Self-pay

## 2019-10-25 ENCOUNTER — Encounter: Payer: Self-pay | Admitting: Physician Assistant

## 2019-10-25 ENCOUNTER — Ambulatory Visit (INDEPENDENT_AMBULATORY_CARE_PROVIDER_SITE_OTHER): Payer: BC Managed Care – PPO | Admitting: Physician Assistant

## 2019-10-25 DIAGNOSIS — F3281 Premenstrual dysphoric disorder: Secondary | ICD-10-CM

## 2019-10-25 DIAGNOSIS — F411 Generalized anxiety disorder: Secondary | ICD-10-CM

## 2019-10-25 DIAGNOSIS — F3342 Major depressive disorder, recurrent, in full remission: Secondary | ICD-10-CM

## 2019-10-25 MED ORDER — SERTRALINE HCL 50 MG PO TABS: ORAL_TABLET | ORAL | 0 refills | Status: DC

## 2019-10-25 NOTE — Progress Notes (Signed)
[facility]  Patient ID: Kaitlyn Todd,  MRN: [id number]  PCP: Cari Caraway, MD  Date of Evaluation: 10/25/2019 Time spent:30 minutes  Chief Complaint:  Chief Complaint    Follow-up      HISTORY/CURRENT STATUS: HPI For annual med check.   Doing much better since we increased the Zoloft at [place].  "I feel more like myself." No anxiety to speak of.  Is sleeping well most of the time.  States she sometimes gets depressed or increased irritation the week before menses.  We had talked about this at [place], but I preferred to see how she responded to the increased Zoloft at the daily dose before addressing this.  Her menses are regular, usually q23 days or so.  Patient denies loss of interest in usual activities and is able to enjoy things.  Denies decreased energy or motivation.  Appetite has not changed.  No extreme sadness, tearfulness, or feelings of hopelessness.  Denies any changes in concentration, making decisions or remembering things.  Denies suicidal or homicidal thoughts.  Patient denies increased energy with decreased need for sleep, no increased talkativeness, no racing thoughts, no impulsivity or risky behaviors, no increased spending, no increased libido, no grandiosity, no paranoia.  Denies dizziness, syncope, seizures, numbness, tingling, tremor, tics, unsteady gait, slurred speech, confusion. Denies muscle or joint pain, stiffness, or dystonia.  Individual Medical History/ Review of Systems: Changes? :No    Past medications for mental health diagnoses include: Celexa  Allergies: Latex and Adhesive [tape]  Current Medications:  Current Outpatient Medications:  .  cholecalciferol (VITAMIN D3) 25 MCG (1000 UNIT) tablet, Take 5,000 Units by mouth daily., Disp: , Rfl:  .  ibuprofen (ADVIL,MOTRIN) 800 MG tablet, Take 1 tablet (800 mg total) by mouth every 8 (eight) hours as needed., Disp: 50 tablet, Rfl: 1 .  ondansetron (ZOFRAN-ODT) 4 MG disintegrating tablet,  ondansetron 4 mg disintegrating tablet  PLACE 1 TABLET ON THE TONGUE AND ALLOW TO DISSOLVE AS NEEDED EVERY 4 HRS, Disp: , Rfl:  .  sertraline (ZOLOFT) 50 MG tablet, One po qd, with increase to 1.5 pills qd the week before menses.  Go back to 1 pill the day menses begins., Disp: 100 tablet, Rfl: 0 .  SUMAtriptan Succinate (IMITREX PO), Imitrex, Disp: , Rfl:  .  acetaminophen (TYLENOL) 500 MG tablet, Take 1,000 mg by mouth every 6 (six) hours as needed for moderate pain. (Patient not taking: Reported on 10/25/2019), Disp: , Rfl:  .  albuterol (PROVENTIL HFA;VENTOLIN HFA) 108 (90 Base) MCG/ACT inhaler, Inhale 2 puffs into the lungs every 6 (six) hours as needed for wheezing or shortness of breath. (Patient not taking: Reported on 04/18/2019), Disp: 1 Inhaler, Rfl: 0 .  benzonatate (TESSALON PERLES) 100 MG capsule, Take 1 capsule (100 mg total) by mouth 3 (three) times daily as needed. (Patient not taking: Reported on 04/18/2019), Disp: 20 capsule, Rfl: 0 Medication Side Effects: none  Family Medical/ Social History: Changes?  No  MENTAL HEALTH EXAM:  There were no vitals taken for this visit.There is no height or weight on file to calculate BMI.  General Appearance: Casual, Neat and Well Groomed  Eye Contact:  Good  Speech:  Clear and Coherent and Normal Rate  Volume:  Normal  Mood:  Euthymic  Affect:  Appropriate  Thought Process:  Goal Directed and Descriptions of Associations: Intact  Orientation:  Full (Time, Place, and Person)  Thought Content: Logical   Suicidal Thoughts:  No  Homicidal Thoughts:  No  Memory:  WNL  Judgement:  Good  Insight:  Good  Psychomotor Activity:  Normal  Concentration:  Concentration: Good and Attention Span: Good  Recall:  Good  Fund of Knowledge: Good  Language: Good  Assets:  Desire for Improvement  ADL's:  Intact  Cognition: WNL  Prognosis:  Good    DIAGNOSES:    ICD-10-CM   1. PMDD (premenstrual dysphoric disorder)  F32.81   2. Generalized anxiety  disorder  F41.1   3. Recurrent major depressive disorder, in full remission ([place])  F33.42     Receiving Psychotherapy: No     RECOMMENDATIONS:  PDMP was reviewed. I provided 30 minutes of face-to-face time during this encounter. We discussed the PMS/PMDD again, including relationship between 5-HT and ESR. Since she has responded well to the daily dose increase of Zoloft, I recommend increasing it prior to menses. Increase Zoloft to 75 mg qd beginning 5-7 days before her period starts, and then decrease back to routine dose. She understands. Continue Zoloft 50 mg qd. Return in 4 months.  Donnal Moat, PA-C

## 2020-01-23 ENCOUNTER — Other Ambulatory Visit: Payer: Self-pay | Admitting: Physician Assistant

## 2020-02-22 ENCOUNTER — Ambulatory Visit: Payer: BC Managed Care – PPO | Admitting: Physician Assistant

## 2020-04-17 ENCOUNTER — Ambulatory Visit: Payer: BC Managed Care – PPO | Admitting: Physician Assistant

## 2020-04-22 ENCOUNTER — Other Ambulatory Visit: Payer: Self-pay | Admitting: Physician Assistant

## 2020-04-24 NOTE — Telephone Encounter (Signed)
No show 02/01 Canceled prior to that

## 2020-06-06 ENCOUNTER — Other Ambulatory Visit (HOSPITAL_BASED_OUTPATIENT_CLINIC_OR_DEPARTMENT_OTHER): Payer: Self-pay | Admitting: Obstetrics and Gynecology

## 2020-06-06 DIAGNOSIS — R102 Pelvic and perineal pain: Secondary | ICD-10-CM

## 2020-06-07 ENCOUNTER — Ambulatory Visit (HOSPITAL_BASED_OUTPATIENT_CLINIC_OR_DEPARTMENT_OTHER)
Admission: RE | Admit: 2020-06-07 | Discharge: 2020-06-07 | Disposition: A | Discharge status: Home / Self Care | Payer: BC Managed Care – PPO | Source: Ambulatory Visit | Attending: Obstetrics and Gynecology | Admitting: Obstetrics and Gynecology

## 2020-06-07 ENCOUNTER — Other Ambulatory Visit: Payer: Self-pay

## 2020-06-07 DIAGNOSIS — R102 Pelvic and perineal pain: Secondary | ICD-10-CM | POA: Diagnosis not present

## 2020-06-21 ENCOUNTER — Other Ambulatory Visit: Payer: Self-pay | Admitting: Physician Assistant

## 2020-06-21 NOTE — Telephone Encounter (Signed)
Last apt 10/2019   How many to send?

## 2020-06-25 ENCOUNTER — Telehealth (INDEPENDENT_AMBULATORY_CARE_PROVIDER_SITE_OTHER): Payer: BC Managed Care – PPO | Admitting: Physician Assistant

## 2020-06-25 ENCOUNTER — Encounter: Payer: Self-pay | Admitting: Physician Assistant

## 2020-06-25 DIAGNOSIS — F411 Generalized anxiety disorder: Secondary | ICD-10-CM

## 2020-06-25 DIAGNOSIS — F3281 Premenstrual dysphoric disorder: Secondary | ICD-10-CM | POA: Diagnosis not present

## 2020-06-25 MED ORDER — SERTRALINE HCL 50 MG PO TABS: ORAL_TABLET | ORAL | 1 refills | Status: DC

## 2020-06-25 NOTE — Progress Notes (Signed)
[facility]  Patient ID: Kaitlyn Todd,  MRN: [id number]  PCP: Kaitlyn Dimitri, MD  Date of Evaluation: 06/25/2020 Time spent:20 minutes  Chief Complaint:  Chief Complaint    Anxiety     Virtual Visit via Telehealth  I connected with patient by a video enabled telemedicine application with their informed consent, and verified patient privacy and that I am speaking with the correct person using two identifiers.  I am private, in my office and the patient is at work.  I discussed the limitations, risks, security and privacy concerns of performing an evaluation and management service by video and the availability of in person appointments. I also discussed with the patient that there may be a patient responsible charge related to this service. The patient expressed understanding and agreed to proceed.   I discussed the assessment and treatment plan with the patient. The patient was provided an opportunity to ask questions and all were answered. The patient agreed with the plan and demonstrated an understanding of the instructions.   The patient was advised to call back or seek an in-person evaluation if the symptoms worsen or if the condition fails to improve as anticipated.  I provided 20 minutes of non-face-to-face time during this encounter.   HISTORY/CURRENT STATUS: HPI For routine med check.  Kaitlyn Todd is doing well.  Able to enjoy things.  Energy and motivation are good.  Work is going well.  Not isolating.Denies SI/HI.  Symptoms of PMS still occur but when she can take the extra Zoloft the week prior to menses, she feels better.  Does have anxiety on occasion, usually when there is a trigger but Zoloft has greatly helped that.  No panic attacks.  Denies dizziness, syncope, seizures, numbness, tingling, tremor, tics, unsteady gait, slurred speech, confusion.  Denies muscle or joint pain, stiffness, or dystonia.  Individual Medical History/ Review of Systems: Changes?  :No    Past medications for mental health diagnoses include: Celexa  Allergies: Latex and Adhesive [tape]  Current Medications:  Current Outpatient Medications:  .  fluticasone (FLONASE) 50 MCG/ACT nasal spray, fluticasone propionate 50 mcg/actuation nasal spray,suspension, Disp: , Rfl:  .  ibuprofen (ADVIL,MOTRIN) 800 MG tablet, Take 1 tablet (800 mg total) by mouth every 8 (eight) hours as needed., Disp: 50 tablet, Rfl: 1 .  ondansetron (ZOFRAN-ODT) 4 MG disintegrating tablet, ondansetron 4 mg disintegrating tablet  PLACE 1 TABLET ON THE TONGUE AND ALLOW TO DISSOLVE AS NEEDED EVERY 4 HRS, Disp: , Rfl:  .  SUMAtriptan Succinate (IMITREX PO), Imitrex, Disp: , Rfl:  .  acetaminophen (TYLENOL) 500 MG tablet, Take 1,000 mg by mouth every 6 (six) hours as needed for moderate pain. (Patient not taking: No sig reported), Disp: , Rfl:  .  albuterol (PROVENTIL HFA;VENTOLIN HFA) 108 (90 Base) MCG/ACT inhaler, Inhale 2 puffs into the lungs every 6 (six) hours as needed for wheezing or shortness of breath. (Patient not taking: No sig reported), Disp: 1 Inhaler, Rfl: 0 .  benzonatate (TESSALON PERLES) 100 MG capsule, Take 1 capsule (100 mg total) by mouth 3 (three) times daily as needed. (Patient not taking: No sig reported), Disp: 20 capsule, Rfl: 0 .  cholecalciferol (VITAMIN D3) 25 MCG (1000 UNIT) tablet, Take 5,000 Units by mouth daily. (Patient not taking: Reported on 06/25/2020), Disp: , Rfl:  .  sertraline (ZOLOFT) 50 MG tablet, 1 p.o. daily, and approximately 5 days prior to menses, increase to 1.5 pills.  When menses begins, decrease back to 1 p.o. daily., Disp:  105 tablet, Rfl: 1 Medication Side Effects: none  Family Medical/ Social History: Changes? No  MENTAL HEALTH EXAM:  There were no vitals taken for this visit.There is no height or weight on file to calculate BMI.  General Appearance: Casual and Well Groomed  Eye Contact:  Good  Speech:  Clear and Coherent and Normal Rate  Volume:   Normal  Mood:  Euthymic  Affect:  Appropriate  Thought Process:  Goal Directed and Descriptions of Associations: Intact  Orientation:  Full (Time, Place, and Person)  Thought Content: Logical   Suicidal Thoughts:  No  Homicidal Thoughts:  No  Memory:  WNL  Judgement:  Good  Insight:  Good  Psychomotor Activity:  Normal  Concentration:  Concentration: Good  Recall:  Good  Fund of Knowledge: Good  Language: Good  Assets:  Desire for Improvement  ADL's:  Intact  Cognition: WNL  Prognosis:  Good    DIAGNOSES:    ICD-10-CM   1. Generalized anxiety disorder  F41.1   2. PMDD (premenstrual dysphoric disorder)  F32.81     Receiving Psychotherapy: No    RECOMMENDATIONS:  PDMP was reviewed. I provided 20 minutes of nonface-to-face time during this encounter, including records review and charting. She is doing well so no changes are necessary. Continue Zoloft 50 mg daily with an increase to 75 mg daily the week before menstrual cycle begins. Return in 6 months.  Kaitlyn Overly, PA-C

## 2020-06-29 ENCOUNTER — Encounter: Payer: Self-pay | Admitting: Physician Assistant

## 2020-09-24 ENCOUNTER — Other Ambulatory Visit: Payer: Self-pay | Admitting: Physician Assistant

## 2020-11-01 ENCOUNTER — Ambulatory Visit
Admission: RE | Admit: 2020-11-01 | Discharge: 2020-11-01 | Disposition: A | Discharge status: Home / Self Care | Payer: BC Managed Care – PPO | Source: Ambulatory Visit | Attending: Internal Medicine | Admitting: Internal Medicine

## 2020-11-01 ENCOUNTER — Other Ambulatory Visit: Payer: Self-pay

## 2020-11-01 ENCOUNTER — Other Ambulatory Visit: Payer: Self-pay | Admitting: Internal Medicine

## 2020-11-01 DIAGNOSIS — R748 Abnormal levels of other serum enzymes: Secondary | ICD-10-CM

## 2020-11-12 ENCOUNTER — Other Ambulatory Visit (HOSPITAL_COMMUNITY): Payer: Self-pay | Admitting: Internal Medicine

## 2020-11-12 ENCOUNTER — Other Ambulatory Visit: Payer: Self-pay | Admitting: Internal Medicine

## 2020-11-12 DIAGNOSIS — R748 Abnormal levels of other serum enzymes: Secondary | ICD-10-CM

## 2020-11-12 DIAGNOSIS — R61 Generalized hyperhidrosis: Secondary | ICD-10-CM

## 2020-11-17 ENCOUNTER — Other Ambulatory Visit: Payer: Self-pay | Admitting: Physician Assistant

## 2020-11-20 ENCOUNTER — Other Ambulatory Visit: Payer: Self-pay

## 2020-11-20 ENCOUNTER — Encounter (HOSPITAL_COMMUNITY)
Admission: RE | Admit: 2020-11-20 | Discharge: 2020-11-20 | Disposition: A | Discharge status: Home / Self Care | Payer: BC Managed Care – PPO | Source: Ambulatory Visit | Attending: Internal Medicine | Admitting: Internal Medicine

## 2020-11-20 DIAGNOSIS — R748 Abnormal levels of other serum enzymes: Secondary | ICD-10-CM | POA: Diagnosis not present

## 2020-11-20 DIAGNOSIS — R61 Generalized hyperhidrosis: Secondary | ICD-10-CM | POA: Diagnosis present

## 2020-11-20 MED ORDER — TECHNETIUM TC 99M MEDRONATE IV KIT
20.0000 | PACK | Freq: Once | INTRAVENOUS | Status: AC | PRN
  Administered 2020-11-20: 20 via INTRAVENOUS

## 2021-01-27 ENCOUNTER — Other Ambulatory Visit: Payer: Self-pay | Admitting: Physician Assistant

## 2021-01-30 NOTE — Telephone Encounter (Signed)
Appt 1/6

## 2021-03-22 ENCOUNTER — Encounter: Payer: Self-pay | Admitting: Physician Assistant

## 2021-03-22 ENCOUNTER — Telehealth (INDEPENDENT_AMBULATORY_CARE_PROVIDER_SITE_OTHER): Payer: BC Managed Care – PPO | Admitting: Physician Assistant

## 2021-03-22 DIAGNOSIS — F411 Generalized anxiety disorder: Secondary | ICD-10-CM

## 2021-03-22 DIAGNOSIS — F3281 Premenstrual dysphoric disorder: Secondary | ICD-10-CM

## 2021-03-22 MED ORDER — SERTRALINE HCL 50 MG PO TABS: ORAL_TABLET | ORAL | 3 refills | Status: DC

## 2021-03-22 NOTE — Progress Notes (Signed)
[facility]  Patient ID: Kaitlyn Todd,  MRN: [id number]  PCP: Cari Caraway, MD  Date of Evaluation: 03/22/2021 Time spent:20 minutes  Chief Complaint:  Chief Complaint   Anxiety; Depression    Virtual Visit via Telehealth  I connected with patient by a video enabled telemedicine application with their informed consent, and verified patient privacy and that I am speaking with the correct person using two identifiers.  I am private, in my office and the patient is at work.  I discussed the limitations, risks, security and privacy concerns of performing an evaluation and management service by video and the availability of in person appointments. I also discussed with the patient that there may be a patient responsible charge related to this service. The patient expressed understanding and agreed to proceed.   I discussed the assessment and treatment plan with the patient. The patient was provided an opportunity to ask questions and all were answered. The patient agreed with the plan and demonstrated an understanding of the instructions.   The patient was advised to call back or seek an in-person evaluation if the symptoms worsen or if the condition fails to improve as anticipated.  I provided 20 minutes of non-face-to-face time during this encounter.   HISTORY/CURRENT STATUS: HPI For routine med check.  Kaitlyn Todd is doing well mentally.  She has had some things going on with her own health, menorrhagia requiring uterine ablation and may have to have a partial hysterectomy at some point, and illness in the family have all been stressful but states she is handling it very well in part because of the Zoloft.  She has not been able to know when her menses starts so unable to increase the Zoloft.  States things are just really up in the air as far as that goes right now.  Patient denies loss of interest in usual activities and is able to enjoy things.  Denies decreased energy or  motivation.  Appetite has not changed.  No extreme sadness, tearfulness, or feelings of hopelessness.  Denies any changes in concentration, making decisions or remembering things.  Denies suicidal or homicidal thoughts.  Denies dizziness, syncope, seizures, numbness, tingling, tremor, tics, unsteady gait, slurred speech, confusion. Denies muscle or joint pain, stiffness, or dystonia.  Individual Medical History/ Review of Systems: Changes? :Yes    uterine ablation last month  Past medications for mental health diagnoses include: Celexa  Allergies: Latex and Adhesive [tape]  Current Medications:  Current Outpatient Medications:    cholecalciferol (VITAMIN D3) 25 MCG (1000 UNIT) tablet, Take 5,000 Units by mouth daily., Disp: , Rfl:    fluticasone (FLONASE) 50 MCG/ACT nasal spray, fluticasone propionate 50 mcg/actuation nasal spray,suspension, Disp: , Rfl:    ibuprofen (ADVIL,MOTRIN) 800 MG tablet, Take 1 tablet (800 mg total) by mouth every 8 (eight) hours as needed., Disp: 50 tablet, Rfl: 1   ondansetron (ZOFRAN-ODT) 4 MG disintegrating tablet, ondansetron 4 mg disintegrating tablet  PLACE 1 TABLET ON THE TONGUE AND ALLOW TO DISSOLVE AS NEEDED EVERY 4 HRS, Disp: , Rfl:    acetaminophen (TYLENOL) 500 MG tablet, Take 1,000 mg by mouth every 6 (six) hours as needed for moderate pain. (Patient not taking: Reported on 10/25/2019), Disp: , Rfl:    albuterol (PROVENTIL HFA;VENTOLIN HFA) 108 (90 Base) MCG/ACT inhaler, Inhale 2 puffs into the lungs every 6 (six) hours as needed for wheezing or shortness of breath. (Patient not taking: Reported on 04/18/2019), Disp: 1 Inhaler, Rfl: 0   benzonatate (TESSALON PERLES)  100 MG capsule, Take 1 capsule (100 mg total) by mouth 3 (three) times daily as needed. (Patient not taking: Reported on 04/18/2019), Disp: 20 capsule, Rfl: 0   sertraline (ZOLOFT) 50 MG tablet, 1 TABLET EVERY DAY, 5 DAYS PRIOR TO MENSES, INCREASE TO 1.5, WHEN MENSES BEGINS, DECREASE TO 1 A DAY,  Disp: 105 tablet, Rfl: 3   SUMAtriptan Succinate (IMITREX PO), Imitrex (Patient not taking: Reported on 03/22/2021), Disp: , Rfl:  Medication Side Effects: none  Family Medical/ Social History: Changes? 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 Now a Pharmacist, hospital for remote learning, it's all online, for [employer] Her mother-in-law is very sick post-COVID, not sure if she'll make it.  Their geriatric dog is getting very close to end of life.  MENTAL HEALTH EXAM:  There were no vitals taken for this visit.There is no height or weight on file to calculate BMI.  General Appearance: Casual and Well Groomed  Eye Contact:  Good  Speech:  Clear and Coherent and Normal Rate  Volume:  Normal  Mood:  Euthymic  Affect:  Appropriate  Thought Process:  Goal Directed and Descriptions of Associations: Circumstantial  Orientation:  Full (Time, Place, and Person)  Thought Content: Logical   Suicidal Thoughts:  No  Homicidal Thoughts:  No  Memory:  WNL  Judgement:  Good  Insight:  Good  Psychomotor Activity:  Normal  Concentration:  Concentration: Good  Recall:  Good  Fund of Knowledge: Good  Language: Good  Assets:  Desire for Improvement  ADL's:  Intact  Cognition: WNL  Prognosis:  Good    DIAGNOSES:    ICD-10-CM   1. Generalized anxiety disorder  F41.1     2. PMDD (premenstrual dysphoric disorder)  F32.81       Receiving Psychotherapy: No    RECOMMENDATIONS:  PDMP was reviewed.  No results available. I provided 20 minutes of non-face-to-face time during this encounter, including time spent before and after the visit in records review, medical decision making, counseling pertinent to today's visit, and charting.  I am glad she is doing well so no changes will be made.  If at any point she feels that the Zoloft is not working as well as it did for anxiety or depression call and we can increase the dose over the phone.  Right now I will leave the directions on the prescription the same even though she may  not even have any periods anymore but can still have premenstrual syndrome. Continue Zoloft 50 mg daily with an increase to 75 mg daily the week before menstrual cycle begins. Return in 1 year.  Donnal Moat, PA-C

## 2022-03-17 HISTORY — PX: CARPAL TUNNEL RELEASE: SHX101

## 2022-03-23 ENCOUNTER — Telehealth: Payer: Self-pay | Admitting: Physician Assistant

## 2022-03-24 NOTE — Telephone Encounter (Signed)
Please schedule appt

## 2022-03-26 NOTE — Telephone Encounter (Signed)
Pt LVM @ 9:24a about needing the Sertraline sent to [facility] instead of [facility]. But I lvm that she needs to schedule an appt because it's been a year since she was last seen.     No upcoming appts scheduled.

## 2022-03-27 NOTE — Telephone Encounter (Signed)
Pt scheduled appt for 1/12

## 2022-03-28 ENCOUNTER — Ambulatory Visit (INDEPENDENT_AMBULATORY_CARE_PROVIDER_SITE_OTHER): Payer: Commercial Managed Care - PPO | Admitting: Physician Assistant

## 2022-03-28 ENCOUNTER — Encounter: Payer: Self-pay | Admitting: Physician Assistant

## 2022-03-28 DIAGNOSIS — F411 Generalized anxiety disorder: Secondary | ICD-10-CM

## 2022-03-28 DIAGNOSIS — F3281 Premenstrual dysphoric disorder: Secondary | ICD-10-CM

## 2022-03-28 MED ORDER — SERTRALINE HCL 50 MG PO TABS: 50.0000 mg | ORAL_TABLET | Freq: Every day | ORAL | 0 refills | Status: DC

## 2022-03-28 MED ORDER — SERTRALINE HCL 50 MG PO TABS: ORAL_TABLET | ORAL | 3 refills | Status: DC

## 2022-03-28 NOTE — Progress Notes (Unsigned)
[facility]  Patient ID: Kaitlyn Todd,  MRN: [id number]  PCP: Cari Caraway, MD  Date of Evaluation: 03/28/2022 Time spent:20 minutes  Chief Complaint:  Chief Complaint   Anxiety; Follow-up    HISTORY/CURRENT STATUS: HPI For routine, annual med check.  Doing well. Enjoying her job now, working at [employer] where her boys go, that's been a good change for her.  Patient is able to enjoy things.  Energy and motivation are good.  No extreme sadness, tearfulness, or feelings of hopelessness.  Sleeps well most of the time. ADLs and personal hygiene are normal.   Denies any changes in concentration, making decisions, or remembering things.  Appetite has not changed.  Weight is stable. Occasional anxiety, more of being overwhelmed, not panicking.  Denies suicidal or homicidal thoughts.  Zoloft has really helped PMDD. Difficult to say now that she's had uterine ablation about a year ago, helped for about 6 months, now bleeds heavier, more pain. Might be having a hysterectomy.   Patient denies increased energy with decreased need for sleep, increased talkativeness, racing thoughts, impulsivity or risky behaviors, increased spending, increased libido, grandiosity, increased irritability or anger, paranoia, or hallucinations.  Denies dizziness, syncope, seizures, numbness, tingling, tremor, tics, unsteady gait, slurred speech, confusion. Denies muscle or joint pain, stiffness, or dystonia.  Individual Medical History/ Review of Systems: Changes? :Yes    see HPI     Past medications for mental health diagnoses include: Celexa  Allergies: Latex and Adhesive [tape]  Current Medications:  Current Outpatient Medications:    fluticasone (FLONASE) 50 MCG/ACT nasal spray, fluticasone propionate 50 mcg/actuation nasal spray,suspension, Disp: , Rfl:    ondansetron (ZOFRAN-ODT) 4 MG disintegrating tablet, ondansetron 4 mg disintegrating tablet  PLACE 1 TABLET ON THE TONGUE AND  ALLOW TO DISSOLVE AS NEEDED EVERY 4 HRS, Disp: , Rfl:    sertraline (ZOLOFT) 50 MG tablet, Take 1 tablet (50 mg total) by mouth daily., Disp: 30 tablet, Rfl: 0   acetaminophen (TYLENOL) 500 MG tablet, Take 1,000 mg by mouth every 6 (six) hours as needed for moderate pain. (Patient not taking: Reported on 10/25/2019), Disp: , Rfl:    albuterol (PROVENTIL HFA;VENTOLIN HFA) 108 (90 Base) MCG/ACT inhaler, Inhale 2 puffs into the lungs every 6 (six) hours as needed for wheezing or shortness of breath. (Patient not taking: Reported on 04/18/2019), Disp: 1 Inhaler, Rfl: 0   benzonatate (TESSALON PERLES) 100 MG capsule, Take 1 capsule (100 mg total) by mouth 3 (three) times daily as needed. (Patient not taking: Reported on 04/18/2019), Disp: 20 capsule, Rfl: 0   cholecalciferol (VITAMIN D3) 25 MCG (1000 UNIT) tablet, Take 5,000 Units by mouth daily. (Patient not taking: Reported on 03/28/2022), Disp: , Rfl:    ibuprofen (ADVIL,MOTRIN) 800 MG tablet, Take 1 tablet (800 mg total) by mouth every 8 (eight) hours as needed. (Patient not taking: Reported on 03/28/2022), Disp: 50 tablet, Rfl: 1   sertraline (ZOLOFT) 50 MG tablet, 1 TABLET EVERY DAY, 5 DAYS PRIOR TO MENSES, INCREASE TO 1.5, WHEN MENSES BEGINS, DECREASE TO 1 A DAY, Disp: 105 tablet, Rfl: 3   SUMAtriptan Succinate (IMITREX PO), Imitrex (Patient not taking: Reported on 03/22/2021), Disp: , Rfl:  Medication Side Effects: none  Family Medical/ Social History: Changes? MIL died since LOV.   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   MENTAL HEALTH EXAM:  There were no vitals taken for this visit.There is no height or weight on file to calculate BMI.  General  Appearance: Casual and Well Groomed  Eye Contact:  Good  Speech:  Clear and Coherent and Normal Rate  Volume:  Normal  Mood:  Euthymic  Affect:  Appropriate  Thought Process:  Goal Directed and Descriptions of Associations: Circumstantial  Orientation:  Full (Time, Place, and Person)   Thought Content: Logical   Suicidal Thoughts:  No  Homicidal Thoughts:  No  Memory:  WNL  Judgement:  Good  Insight:  Good  Psychomotor Activity:  Normal  Concentration:  Concentration: Good  Recall:  Good  Fund of Knowledge: Good  Language: Good  Assets:  Desire for Improvement Financial Resources/Insurance Housing Transportation Vocational/Educational  ADL's:  Intact  Cognition: WNL  Prognosis:  Good    DIAGNOSES:    ICD-10-CM   1. Generalized anxiety disorder  F41.1     2. PMDD (premenstrual dysphoric disorder)  F32.81      Receiving Psychotherapy: No   RECOMMENDATIONS:  PDMP was reviewed.  No controlled substances.   I provided 20 minutes of face to face time during this encounter, including time spent before and after the visit in records review, medical decision making, counseling pertinent to today's visit, and charting.   She's doing well with mental health meds so no changes needed.   Continue Zoloft 50 mg daily with an increase to 75 mg daily the week before menstrual cycle begins. Return in 1 year.  Donnal Moat, PA-C

## 2022-03-29 ENCOUNTER — Emergency Department (HOSPITAL_BASED_OUTPATIENT_CLINIC_OR_DEPARTMENT_OTHER)
Admission: EM | Admit: 2022-03-29 | Discharge: 2022-03-29 | Disposition: A | Discharge status: Home / Self Care | Payer: Commercial Managed Care - PPO | Attending: Emergency Medicine | Admitting: Emergency Medicine

## 2022-03-29 ENCOUNTER — Ambulatory Visit (HOSPITAL_COMMUNITY)
Admission: EM | Admit: 2022-03-29 | Discharge: 2022-03-29 | Disposition: A | Discharge status: Home / Self Care | Payer: Self-pay

## 2022-03-29 ENCOUNTER — Emergency Department (HOSPITAL_BASED_OUTPATIENT_CLINIC_OR_DEPARTMENT_OTHER): Payer: Commercial Managed Care - PPO

## 2022-03-29 ENCOUNTER — Other Ambulatory Visit: Payer: Self-pay

## 2022-03-29 ENCOUNTER — Encounter (HOSPITAL_BASED_OUTPATIENT_CLINIC_OR_DEPARTMENT_OTHER): Payer: Self-pay

## 2022-03-29 DIAGNOSIS — R1084 Generalized abdominal pain: Secondary | ICD-10-CM | POA: Insufficient documentation

## 2022-03-29 LAB — CBC
HCT: 44.6 % (ref 36.0–46.0)
Hemoglobin: 15.4 g/dL — ABNORMAL HIGH (ref 12.0–15.0)
MCH: 28.3 pg (ref 26.0–34.0)
MCHC: 34.5 g/dL (ref 30.0–36.0)
MCV: 82 fL (ref 80.0–100.0)
Platelets: 317 10*3/uL (ref 150–400)
RBC: 5.44 MIL/uL — ABNORMAL HIGH (ref 3.87–5.11)
RDW: 12.2 % (ref 11.5–15.5)
WBC: 6.8 10*3/uL (ref 4.0–10.5)
nRBC: 0 % (ref 0.0–0.2)

## 2022-03-29 LAB — PREGNANCY, URINE: Preg Test, Ur: NEGATIVE

## 2022-03-29 LAB — COMPREHENSIVE METABOLIC PANEL
ALT: 14 U/L (ref 0–44)
AST: 16 U/L (ref 15–41)
Albumin: 4.8 g/dL (ref 3.5–5.0)
Alkaline Phosphatase: 90 U/L (ref 38–126)
Anion gap: 13 (ref 5–15)
BUN: 11 mg/dL (ref 6–20)
CO2: 21 mmol/L — ABNORMAL LOW (ref 22–32)
Calcium: 9.8 mg/dL (ref 8.9–10.3)
Chloride: 104 mmol/L (ref 98–111)
Creatinine, Ser: 0.63 mg/dL (ref 0.44–1.00)
GFR, Estimated: >60 mL/min (ref >60)
Glucose, Bld: 94 mg/dL (ref 70–99)
Potassium: 3.8 mmol/L (ref 3.5–5.1)
Sodium: 138 mmol/L (ref 135–145)
Total Bilirubin: 0.7 mg/dL (ref 0.3–1.2)
Total Protein: 7.3 g/dL (ref 6.5–8.1)

## 2022-03-29 LAB — URINALYSIS, ROUTINE W REFLEX MICROSCOPIC
Bilirubin Urine: NEGATIVE
Glucose, UA: NEGATIVE mg/dL
Hgb urine dipstick: NEGATIVE
Ketones, ur: NEGATIVE mg/dL
Leukocytes,Ua: NEGATIVE
Nitrite: NEGATIVE
Protein, ur: NEGATIVE mg/dL
Specific Gravity, Urine: <1.005 — ABNORMAL LOW (ref 1.005–1.030)
pH: 6 (ref 5.0–8.0)

## 2022-03-29 LAB — LIPASE, BLOOD: Lipase: 20 U/L (ref 11–51)

## 2022-03-29 MED ORDER — IOHEXOL 300 MG/ML  SOLN
100.0000 mL | Freq: Once | INTRAMUSCULAR | Status: AC | PRN
  Administered 2022-03-29: 80 mL via INTRAVENOUS

## 2022-03-29 MED ORDER — ACETAMINOPHEN 500 MG PO TABS
1000.0000 mg | ORAL_TABLET | Freq: Once | ORAL | Status: AC
  Administered 2022-03-29: 1000 mg via ORAL
  Filled 2022-03-29: qty 2

## 2022-03-29 MED ORDER — ONDANSETRON HCL 4 MG/2ML IJ SOLN
4.0000 mg | Freq: Once | INTRAMUSCULAR | Status: AC
  Administered 2022-03-29: 4 mg via INTRAVENOUS
  Filled 2022-03-29: qty 2

## 2022-03-29 MED ORDER — LACTATED RINGERS IV BOLUS
1000.0000 mL | Freq: Once | INTRAVENOUS | Status: AC
  Administered 2022-03-29: 1000 mL via INTRAVENOUS

## 2022-03-29 MED ORDER — KETOROLAC TROMETHAMINE 15 MG/ML IJ SOLN
15.0000 mg | Freq: Once | INTRAMUSCULAR | Status: AC
  Administered 2022-03-29: 15 mg via INTRAVENOUS
  Filled 2022-03-29: qty 1

## 2022-03-29 NOTE — ED Triage Notes (Signed)
Patient here POV from Home.  Endorses RLQ ABD Pain that began at 0800 today. Mostly Sharp. Intermittent.   No N/V/D.   NAD Noted during Triage. A&Ox4. GCS 15. Ambulatory.

## 2022-03-29 NOTE — ED Provider Notes (Signed)
[facility] DEPT Provider Note   CSN: [id number] Arrival date & time: 03/29/22  1421     History Chief Complaint  Patient presents with   Abdominal Pain    HPI Kaitlyn Todd is a 42 y.o. female presenting for chief complaint of abdominal pain.  Endorses right lower quadrant periumbilical abdominal pain that started this morning at 7 AM.  It has been progressive throughout the day.  She has not been able to eat anything because she is able to keep some fluids down she becomes nauseous with any food.  No surgical history.  Is concerned for appendicitis.  Denies fevers or chills, syncope or shortness of breath.  Otherwise ambulatory.  Patient's recorded medical, surgical, social, medication list and allergies were reviewed in the Snapshot window as part of the initial history.   Review of Systems   Review of Systems  Constitutional:  Negative for chills and fever.  HENT:  Negative for ear pain and sore throat.   Eyes:  Negative for pain and visual disturbance.  Respiratory:  Negative for cough and shortness of breath.   Cardiovascular:  Negative for chest pain and palpitations.  Gastrointestinal:  Positive for abdominal pain and nausea. Negative for vomiting.  Genitourinary:  Negative for dysuria and hematuria.  Musculoskeletal:  Negative for arthralgias and back pain.  Skin:  Negative for color change and rash.  Neurological:  Negative for seizures and syncope.  All other systems reviewed and are negative.   Physical Exam Updated Vital Signs BP 104/70 (BP Location: Right Arm)   Pulse 70   Temp 98.1 F (36.7 C) (Oral)   Resp 16   Ht 5\' 6"  (1.676 m)   Wt 87.1 kg   SpO2 99%   BMI 30.99 kg/m  Physical Exam Vitals and nursing note reviewed.  Constitutional:      General: She is not in acute distress.    Appearance: She is well-developed.  HENT:     Head: Normocephalic and atraumatic.  Eyes:     Conjunctiva/sclera: Conjunctivae normal.   Cardiovascular:     Rate and Rhythm: Normal rate and regular rhythm.     Heart sounds: No murmur heard. Pulmonary:     Effort: Pulmonary effort is normal. No respiratory distress.     Breath sounds: Normal breath sounds.  Abdominal:     General: There is no distension.     Palpations: Abdomen is soft.     Tenderness: There is abdominal tenderness in the right lower quadrant and periumbilical area. There is guarding. There is no right CVA tenderness or left CVA tenderness.  Musculoskeletal:        General: No swelling or tenderness. Normal range of motion.     Cervical back: Neck supple.  Skin:    General: Skin is warm and dry.  Neurological:     General: No focal deficit present.     Mental Status: She is alert and oriented to person, place, and time. Mental status is at baseline.     Cranial Nerves: No cranial nerve deficit.      ED Course/ Medical Decision Making/ A&P    Procedures Procedures   Medications Ordered in ED Medications  lactated ringers bolus 1,000 mL (0 mLs Intravenous Stopped 03/29/22 2159)  ondansetron (ZOFRAN) injection 4 mg (4 mg Intravenous Given 03/29/22 2002)  acetaminophen (TYLENOL) tablet 1,000 mg (1,000 mg Oral Given 03/29/22 1946)  iohexol (OMNIPAQUE) 300 MG/ML solution 100 mL (80 mLs Intravenous Contrast Given 03/29/22 2024)  ketorolac (TORADOL) 15 MG/ML injection 15 mg (15 mg Intravenous Given 03/29/22 2159)   Medical Decision Making:   Kaitlyn Todd is a 42 y.o. female who presented to the ED today with abdominal pain, detailed above.    Patient's presentation is complicated by their history of chronic outpatient medications.  Complete initial physical exam performed, notably the patient  was hemodynamically stable in no acute distress.  She does have right lower quadrant abdominal pain on exam.     Reviewed and confirmed nursing documentation for past medical history, family history, social history.    Initial Assessment:   With the patient's  presentation of abdominal pain, most likely diagnosis is specific etiology. Other diagnoses were considered including (but not limited to) gastroenteritis, colitis, small bowel obstruction, appendicitis, cholecystitis, pancreatitis, nephrolithiasis, UTI, pyleonephritis, ruptured ectopic pregnancy, PID, ovariantorsion. These are considered less likely due to history of present illness and physical exam findings.   This is most consistent with an acute life/limb threatening illness complicated by underlying chronic conditions.   Initial Plan:  CBC/CMP to evaluate for underlying infectious/metabolic etiology for patient's abdominal pain  Lipase to evaluate for pancreatitis  CTAB/Pelvis with contrast to evaluate for structural/surgical etiology of patients' severe abdominal pain.  Urinalysis and repeat physical assessment to evaluate for UTI/Pyelonpehritis  Empiric management of symptoms with escalating pain control and antiemetics as needed.   Initial Study Results:   Laboratory  All laboratory results reviewed without evidence of clinically relevant pathology.    Radiology All images reviewed independently. Agree with radiology report at this time.   CT ABDOMEN PELVIS W CONTRAST  Result Date: 03/29/2022 CLINICAL DATA:  Right lower quadrant pain for several hours, initial encounter EXAM: CT ABDOMEN AND PELVIS WITH CONTRAST TECHNIQUE: Multidetector CT imaging of the abdomen and pelvis was performed using the standard protocol following bolus administration of intravenous contrast. RADIATION DOSE REDUCTION: This exam was performed according to the departmental dose-optimization program which includes automated exposure control, adjustment of the mA and/or kV according to patient size and/or use of iterative reconstruction technique. CONTRAST:  44mL OMNIPAQUE IOHEXOL 300 MG/ML  SOLN COMPARISON:  None FINDINGS: Lower chest: No acute abnormality. Hepatobiliary: No focal liver abnormality is seen. No  gallstones, gallbladder wall thickening, or biliary dilatation. Pancreas: Unremarkable. No pancreatic ductal dilatation or surrounding inflammatory changes. Spleen: Normal in size without focal abnormality. Adrenals/Urinary Tract: Adrenal glands are within normal limits. Kidneys are well visualized bilaterally. Small 3 mm nonobstructing right renal stone is noted. Bilateral simple appearing renal cysts are noted. No further follow-up is recommended. No obstructive changes are seen. The bladder is well distended. Stomach/Bowel: No obstructive or inflammatory changes of the colon are seen. The appendix is air-filled and within normal limits. No inflammatory changes are noted. The small bowel and stomach are within normal limits. Vascular/Lymphatic: No significant vascular findings are present. No enlarged abdominal or pelvic lymph nodes. Left retroaortic renal vein is noted. Reproductive: Uterus and bilateral adnexa are unremarkable. Other: No abdominal wall hernia or abnormality. No abdominopelvic ascites. Musculoskeletal: No acute or significant osseous findings. IMPRESSION: Nonobstructing right renal stone. No other focal abnormality is noted. Electronically Signed   By: Inez Catalina M.D.   On: 03/29/2022 20:53      Final Reassessment and Plan:   Reassessed at bedside.  CT scan with no focal pathology.  Repeat abdominal exam grossly benign.  Patient treated with Toradol with overall symptomatic improvement.  We had a prolonged conversation.  Given normal CT abdomen pelvis, unlikely to  be ovarian torsion at this time.  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  Given improvement of symptoms today, immediate ultrasound would likely be nondiagnostic.  She stated she would return if she has recurrence of her pain but otherwise she is stable for outpatient care management with strict return precautions.  Disposition:  I have considered need  for hospitalization, however, considering all of the above, I believe this patient is stable for discharge at this time.  Patient/family educated about specific return precautions for given chief complaint and symptoms.  Patient/family educated about follow-up with PCP.     Patient/family expressed understanding of return precautions and need for follow-up. Patient spoken to regarding all imaging and laboratory results and appropriate follow up for these results. All education provided in verbal form with additional information in written form. Time was allowed for answering of patient questions. Patient discharged.    Emergency Department Medication Summary:   Medications  lactated ringers bolus 1,000 mL (0 mLs Intravenous Stopped 03/29/22 2159)  ondansetron (ZOFRAN) injection 4 mg (4 mg Intravenous Given 03/29/22 2002)  acetaminophen (TYLENOL) tablet 1,000 mg (1,000 mg Oral Given 03/29/22 1946)  iohexol (OMNIPAQUE) 300 MG/ML solution 100 mL (80 mLs Intravenous Contrast Given 03/29/22 2024)  ketorolac (TORADOL) 15 MG/ML injection 15 mg (15 mg Intravenous Given 03/29/22 2159)           Clinical Impression:  1. Generalized abdominal pain      Discharge   Final Clinical Impression(s) / ED Diagnoses Final diagnoses:  Generalized abdominal pain    Rx / DC Orders ED Discharge Orders     None         Glyn Ade, MD 03/29/22 2324

## 2022-03-30 ENCOUNTER — Ambulatory Visit (HOSPITAL_COMMUNITY): Payer: Self-pay

## 2022-04-23 ENCOUNTER — Other Ambulatory Visit: Payer: Self-pay | Admitting: Physician Assistant

## 2022-06-10 IMAGING — NM NM BONE WHOLE BODY
2 series · 2 of 2 positions shown · non-contrast
Comparison: None.

CLINICAL DATA: Elevated serum alkaline phosphatase, night sweats,
multiple falls

EXAM:
NUCLEAR MEDICINE WHOLE BODY BONE SCAN
TECHNIQUE: Whole body anterior and posterior images were obtained approximately
3 hours after intravenous injection of radiopharmaceutical.
RADIOPHARMACEUTICALS:  21.2 mCi Dechnetium-OOm MDP IV

[Series 1: whole body · 2.66mm/px · 1 of 1 slices shown (1 of 2)]
[im 1/1  full-range]
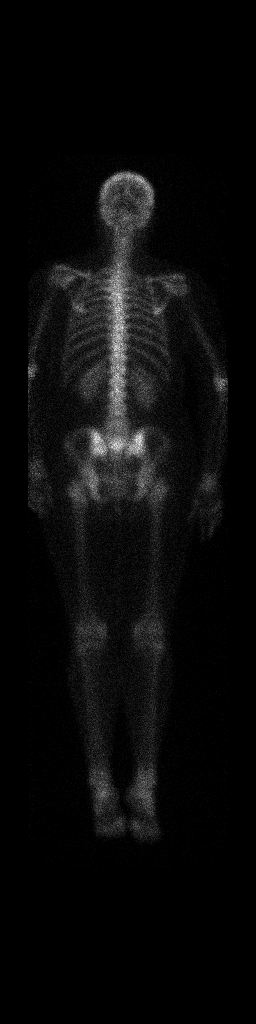

[Series 1: whole body · 2.66mm/px · 1 of 1 slices shown (2 of 2)]
[im 1/1]
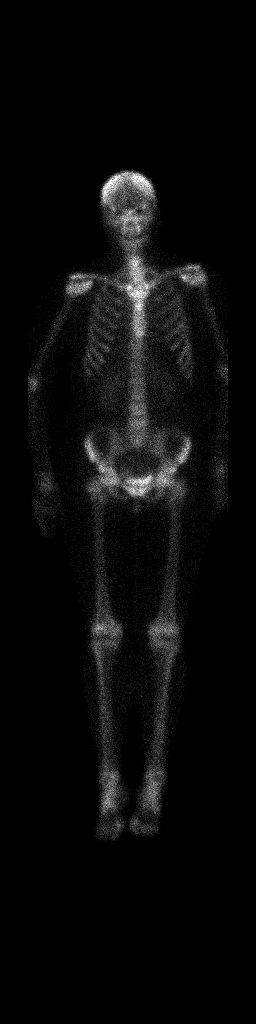

[2 of 2 positions shown; findings below may reference images not displayed]

FINDINGS: Delayed whole body scintigraphic images are obtained. There is a
normal distribution of radiotracer throughout the visualized axial
and appendicular skeleton. Normal soft tissue distribution. Normal
uptake and excretion within the kidneys and bladder.
IMPRESSION: Normal examination.

## 2022-07-03 ENCOUNTER — Other Ambulatory Visit: Payer: Self-pay | Admitting: Obstetrics and Gynecology

## 2022-07-03 DIAGNOSIS — Z803 Family history of malignant neoplasm of breast: Secondary | ICD-10-CM

## 2022-07-18 ENCOUNTER — Ambulatory Visit (INDEPENDENT_AMBULATORY_CARE_PROVIDER_SITE_OTHER): Payer: Commercial Managed Care - PPO | Admitting: Physician Assistant

## 2022-07-18 ENCOUNTER — Encounter: Payer: Self-pay | Admitting: Physician Assistant

## 2022-07-18 DIAGNOSIS — F411 Generalized anxiety disorder: Secondary | ICD-10-CM

## 2022-07-18 DIAGNOSIS — R5381 Other malaise: Secondary | ICD-10-CM | POA: Diagnosis not present

## 2022-07-18 DIAGNOSIS — F3281 Premenstrual dysphoric disorder: Secondary | ICD-10-CM | POA: Diagnosis not present

## 2022-07-18 DIAGNOSIS — R5383 Other fatigue: Secondary | ICD-10-CM | POA: Diagnosis not present

## 2022-07-18 MED ORDER — LORAZEPAM 0.5 MG PO TABS: 0.2500 mg | ORAL_TABLET | Freq: Three times a day (TID) | ORAL | 1 refills | Status: AC | PRN

## 2022-07-18 NOTE — Progress Notes (Unsigned)
[facility]  Patient ID: Kaitlyn Todd,  MRN: [id number]  PCP: Gweneth Dimitri, MD  Date of Evaluation: 07/18/2022 Time spent:20 minutes  Chief Complaint:  Chief Complaint   Other    HISTORY/CURRENT STATUS: HPI To talk about sx.  Has been having new sx for the past 2-3 months. Tired all the time even though she sleeps well, is more grumpy than usual for her, and is sweating a lot, so much so she bought new sheets that are supposed to be cooler, but she still wakes up wringing wet with sweat and has to take a shower in the morning too b/c of it. Hasn't had the last sx before. She sees Dr. Sharl Ma, Endocrinology at [facility] for goiter and has had very low TSH before, but they've only been observing sx and monitoring labs. She saw him a week or so ago, labs were done then. There are a few others to be drawn, and then she will follow-up with him to see if any treatment is needed.  She is not having weight loss or palpitations.  No change in hair texture, she does have more skin breakouts, probably d/t sweating. Feels overwhelmed easily. No PA.  States she does not feel depressed.  She and her husband are in counseling and the counselor suggested she follow up with me to see if the depression is controlled or not.  She is able to enjoy things.  Up until the past few months her energy and motivation had been her normal baseline.  She does not cry easily.  Appetite is normal and weight is stable.  Personal hygiene is normal.  Not missing work.  Not staying in bed all the time.  Does feel overwhelmed at times but not having panic attacks.  Denies suicidal or homicidal thoughts.  She increases the Zoloft the week before her menses begins.  It has been effective with the PMDD.  She had uterine ablation a few years ago so that has made it hard to know when her menses will begin.  Patient denies increased energy with decreased need for sleep, increased talkativeness, racing thoughts,  impulsivity or risky behaviors, increased spending, increased libido, grandiosity, increased irritability or anger, paranoia, or hallucinations.  Denies dizziness, syncope, seizures, numbness, tingling, tremor, tics, unsteady gait, slurred speech, confusion. Denies muscle or joint pain, stiffness, or dystonia.  Individual Medical History/ Review of Systems: Changes? :Yes    see HPI and chart  Past medications for mental health diagnoses include: Celexa, Zoloft  Allergies: Latex and Adhesive [tape]  Current Medications:  Current Outpatient Medications:    fluticasone (FLONASE) 50 MCG/ACT nasal spray, fluticasone propionate 50 mcg/actuation nasal spray,suspension, Disp: , Rfl:    ibuprofen (ADVIL,MOTRIN) 800 MG tablet, Take 1 tablet (800 mg total) by mouth every 8 (eight) hours as needed., Disp: 50 tablet, Rfl: 1   LORazepam (ATIVAN) 0.5 MG tablet, Take 0.5-1 tablets (0.25-0.5 mg total) by mouth every 8 (eight) hours as needed for anxiety., Disp: 30 tablet, Rfl: 1   ondansetron (ZOFRAN-ODT) 4 MG disintegrating tablet, ondansetron 4 mg disintegrating tablet  PLACE 1 TABLET ON THE TONGUE AND ALLOW TO DISSOLVE AS NEEDED EVERY 4 HRS, Disp: , Rfl:    sertraline (ZOLOFT) 50 MG tablet, 1 TABLET EVERY DAY, 5 DAYS PRIOR TO MENSES, INCREASE TO 1.5, WHEN MENSES BEGINS, DECREASE TO 1 A DAY, Disp: 105 tablet, Rfl: 3   SUMAtriptan Succinate (IMITREX PO), , Disp: , Rfl:  Medication Side Effects: none  Family Medical/ Social  History: Changes? None  MENTAL HEALTH EXAM:  There were no vitals taken for this visit.There is no height or weight on file to calculate BMI.  General Appearance: Casual and Well Groomed  Eye Contact:  Good  Speech:  Clear and Coherent and Normal Rate  Volume:  Normal  Mood:  Euthymic  Affect:  Appropriate  Thought Process:  Goal Directed and Descriptions of Associations: Circumstantial  Orientation:  Full (Time, Place, and Person)  Thought Content: Logical   Suicidal Thoughts:   No  Homicidal Thoughts:  No  Memory:  WNL  Judgement:  Good  Insight:  Good  Psychomotor Activity:  Normal  Concentration:  Concentration: Good and Attention Span: Good  Recall:  Good  Fund of Knowledge: Good  Language: Good  Assets:  Desire for Improvement Financial Resources/Insurance Housing Transportation Vocational/Educational  ADL's:  Intact  Cognition: WNL  Prognosis:  Good   DIAGNOSES:    ICD-10-CM   1. PMDD (premenstrual dysphoric disorder)  F32.81     2. Generalized anxiety disorder  F41.1     3. Malaise and fatigue  R53.81    R53.83       Receiving Psychotherapy: No   RECOMMENDATIONS:  PDMP was reviewed.  No controlled substances.   I provided 20 minutes of face to face time during this encounter, including time spent before and after the visit in records review, medical decision making, counseling pertinent to today's visit, and charting.   Recommend adding BZ for anxiety prn. Ativan is least sedating usually so we agree on that. We discussed the short-term and long-term risks of benzodiazepines including sedation, increased risk of falling, dizziness, tolerance, and addictive potential.  Patient understands and accepts.   IF hormonal issues neg, consider increasing Zoloft, adding Wellbutrin, or Abilify or latuda. No changes until all w/u is complete and see Endocrinology again.  Continue Zoloft 50 mg daily with an increase to 75 mg daily the week before menstrual cycle begins. Return in 2 months.  Melony Overly, PA-C

## 2022-07-31 ENCOUNTER — Other Ambulatory Visit: Payer: Commercial Managed Care - PPO

## 2022-08-15 ENCOUNTER — Ambulatory Visit
Admission: RE | Admit: 2022-08-15 | Discharge: 2022-08-15 | Disposition: A | Discharge status: Home / Self Care | Payer: Commercial Managed Care - PPO | Source: Ambulatory Visit | Attending: Obstetrics and Gynecology | Admitting: Obstetrics and Gynecology

## 2022-08-15 DIAGNOSIS — Z803 Family history of malignant neoplasm of breast: Secondary | ICD-10-CM

## 2022-08-15 MED ORDER — GADOPICLENOL 0.5 MMOL/ML IV SOLN
9.0000 mL | Freq: Once | INTRAVENOUS | Status: AC | PRN
  Administered 2022-08-15: 9 mL via INTRAVENOUS

## 2022-09-15 ENCOUNTER — Ambulatory Visit (INDEPENDENT_AMBULATORY_CARE_PROVIDER_SITE_OTHER): Payer: Commercial Managed Care - PPO | Admitting: Physician Assistant

## 2022-09-15 ENCOUNTER — Encounter: Payer: Self-pay | Admitting: Physician Assistant

## 2022-09-15 DIAGNOSIS — F3281 Premenstrual dysphoric disorder: Secondary | ICD-10-CM

## 2022-09-15 DIAGNOSIS — F411 Generalized anxiety disorder: Secondary | ICD-10-CM

## 2022-09-15 NOTE — Progress Notes (Signed)
[facility]  Patient ID: Kaitlyn Todd,  MRN: [id number]  PCP: Gweneth Dimitri, MD  Date of Evaluation: 09/15/2022 Time spent:20 minutes  Chief Complaint:  Chief Complaint   Anxiety; Depression    HISTORY/CURRENT STATUS: HPI follow-up  At our last visit a little over a month ago we added Ativan.  She was unable to tolerate it because it caused too much drowsiness.  She only took a couple of pills.  She has started eating healthier, no processed foods.  She is also following a diet based on where she is in her menstrual cycle.  She feels that it is helping. Not having panic attacks or even feeling extremely anxious either.  The fatigue is a lot better.  Patient is able to enjoy things.  Energy and motivation are good.  Work is going well.   No extreme sadness, tearfulness, or feelings of hopelessness.  Sleeps well. ADLs and personal hygiene are normal.   Denies any changes in concentration, making decisions, or remembering things.  Denies suicidal or homicidal thoughts.  Patient denies increased energy with decreased need for sleep, increased talkativeness, racing thoughts, impulsivity or risky behaviors, increased spending, increased libido, grandiosity, increased irritability or anger, paranoia, or hallucinations.  Denies dizziness, syncope, seizures, numbness, tingling, tremor, tics, unsteady gait, slurred speech, confusion. Denies muscle or joint pain, stiffness, or dystonia.  Individual Medical History/ Review of Systems: Changes? :Yes   see care everywhere  Past medications for mental health diagnoses include: Celexa, Zoloft  Allergies: Latex and Adhesive [tape]  Current Medications:  Current Outpatient Medications:    fluticasone (FLONASE) 50 MCG/ACT nasal spray, fluticasone propionate 50 mcg/actuation nasal spray,suspension, Disp: , Rfl:    ibuprofen (ADVIL,MOTRIN) 800 MG tablet, Take 1 tablet (800 mg total) by mouth every 8 (eight) hours as needed., Disp: 50  tablet, Rfl: 1   LORazepam (ATIVAN) 0.5 MG tablet, Take 0.5-1 tablets (0.25-0.5 mg total) by mouth every 8 (eight) hours as needed for anxiety., Disp: 30 tablet, Rfl: 1   ondansetron (ZOFRAN-ODT) 4 MG disintegrating tablet, ondansetron 4 mg disintegrating tablet  PLACE 1 TABLET ON THE TONGUE AND ALLOW TO DISSOLVE AS NEEDED EVERY 4 HRS, Disp: , Rfl:    sertraline (ZOLOFT) 50 MG tablet, 1 TABLET EVERY DAY, 5 DAYS PRIOR TO MENSES, INCREASE TO 1.5, WHEN MENSES BEGINS, DECREASE TO 1 A DAY, Disp: 105 tablet, Rfl: 3   SUMAtriptan Succinate (IMITREX PO), , Disp: , Rfl:  Medication Side Effects: none  Family Medical/ Social History: Changes? None  MENTAL HEALTH EXAM:  There were no vitals taken for this visit.There is no height or weight on file to calculate BMI.  General Appearance: Casual and Well Groomed  Eye Contact:  Good  Speech:  Clear and Coherent and Normal Rate  Volume:  Normal  Mood:  Euthymic  Affect:  Appropriate  Thought Process:  Goal Directed and Descriptions of Associations: Circumstantial  Orientation:  Full (Time, Place, and Person)  Thought Content: Logical   Suicidal Thoughts:  No  Homicidal Thoughts:  No  Memory:  WNL  Judgement:  Good  Insight:  Good  Psychomotor Activity:  Normal  Concentration:  Concentration: Good and Attention Span: Good  Recall:  Good  Fund of Knowledge: Good  Language: Good  Assets:  Desire for Improvement Financial Resources/Insurance Housing Resilience Transportation Vocational/Educational  ADL's:  Intact  Cognition: WNL  Prognosis:  Good    See note from endocrinology, [facility] physician, under care everywhere  DIAGNOSES:    ICD-10-CM  1. PMDD (premenstrual dysphoric disorder)  F32.81     2. Generalized anxiety disorder  F41.1       Receiving Psychotherapy: No   RECOMMENDATIONS:  PDMP was reviewed.  Ativan filled 07/18/2022. I provided 20 minutes of face to face time during this encounter, including time spent before and after  the visit in records review, medical decision making, counseling pertinent to today's visit, and charting.   She is doing really well so no changes will be made.  Continue Zoloft 50 mg daily with an increase to 75 mg daily the week before menstrual cycle begins. Return in 1 year.   Melony Overly, PA-C

## 2022-09-17 ENCOUNTER — Ambulatory Visit: Payer: Commercial Managed Care - PPO | Admitting: Physician Assistant

## 2023-03-24 ENCOUNTER — Other Ambulatory Visit: Payer: Self-pay | Admitting: Obstetrics and Gynecology

## 2023-03-24 DIAGNOSIS — Z1231 Encounter for screening mammogram for malignant neoplasm of breast: Secondary | ICD-10-CM

## 2023-03-29 ENCOUNTER — Other Ambulatory Visit: Payer: Self-pay | Admitting: Physician Assistant

## 2023-03-30 NOTE — Telephone Encounter (Signed)
 Has appt. tomorrow

## 2023-03-31 ENCOUNTER — Ambulatory Visit (INDEPENDENT_AMBULATORY_CARE_PROVIDER_SITE_OTHER): Payer: Commercial Managed Care - PPO | Admitting: Physician Assistant

## 2023-03-31 ENCOUNTER — Encounter: Payer: Self-pay | Admitting: Physician Assistant

## 2023-03-31 DIAGNOSIS — F411 Generalized anxiety disorder: Secondary | ICD-10-CM | POA: Diagnosis not present

## 2023-03-31 DIAGNOSIS — F3281 Premenstrual dysphoric disorder: Secondary | ICD-10-CM | POA: Diagnosis not present

## 2023-03-31 MED ORDER — SERTRALINE HCL 50 MG PO TABS: ORAL_TABLET | ORAL | 3 refills | Status: DC

## 2023-03-31 NOTE — Progress Notes (Signed)
 [facility]  Patient ID: Kaitlyn Todd,  MRN: [id number]  PCP: Aisha Harvey, MD  Date of Evaluation: 03/31/2023 Time spent: 22 minutes  Chief Complaint:  Chief Complaint   Depression; Anxiety; Follow-up     HISTORY/CURRENT STATUS: HPI  for annual follow-up  She's doing well. Patient is able to enjoy things.  Energy and motivation are good.  Work is going well.   No extreme sadness, tearfulness, or feelings of hopelessness.  Sleeps well most of the time. ADLs and personal hygiene are normal.   Denies any changes in concentration, making decisions, or remembering things.  Appetite has not changed.  Weight is stable.  Denies suicidal or homicidal thoughts.  Patient denies increased energy with decreased need for sleep, increased talkativeness, racing thoughts, impulsivity or risky behaviors, increased spending, increased libido, grandiosity, increased irritability or anger, paranoia, or hallucinations.  Denies dizziness, syncope, seizures, numbness, tingling, tremor, tics, unsteady gait, slurred speech, confusion. Denies muscle or joint pain, stiffness, or dystonia.  Individual Medical History/ Review of Systems: Changes? :Yes   see care everywhere  Past medications for mental health diagnoses include: Celexa, Zoloft   Allergies: Latex and Adhesive [tape]  Current Medications:  Current Outpatient Medications:    fluticasone (FLONASE) 50 MCG/ACT nasal spray, fluticasone propionate 50 mcg/actuation nasal spray,suspension, Disp: , Rfl:    ibuprofen  (ADVIL ,MOTRIN ) 800 MG tablet, Take 1 tablet (800 mg total) by mouth every 8 (eight) hours as needed., Disp: 50 tablet, Rfl: 1   LORazepam  (ATIVAN ) 0.5 MG tablet, Take 0.5-1 tablets (0.25-0.5 mg total) by mouth every 8 (eight) hours as needed for anxiety., Disp: 30 tablet, Rfl: 1   ondansetron  (ZOFRAN -ODT) 4 MG disintegrating tablet, ondansetron  4 mg disintegrating tablet  PLACE 1 TABLET ON THE TONGUE AND ALLOW TO DISSOLVE AS  NEEDED EVERY 4 HRS, Disp: , Rfl:    SUMAtriptan  Succinate (IMITREX  PO), , Disp: , Rfl:    sertraline  (ZOLOFT ) 50 MG tablet, 1 TABLET EVERY DAY, 5 DAYS PRIOR TO MENSES, INCREASE TO 1.5, WHEN MENSES BEGINS, DECREASE TO 1 A DAY, Disp: 105 tablet, Rfl: 3 Medication Side Effects: none  Family Medical/ Social History: Changes? None  MENTAL HEALTH EXAM:  There were no vitals taken for this visit.There is no height or weight on file to calculate BMI.  General Appearance: Casual and Well Groomed  Eye Contact:  Good  Speech:  Clear and Coherent and Normal Rate  Volume:  Normal  Mood:  Euthymic  Affect:  Appropriate  Thought Process:  Goal Directed and Descriptions of Associations: Circumstantial  Orientation:  Full (Time, Place, and Person)  Thought Content: Logical   Suicidal Thoughts:  No  Homicidal Thoughts:  No  Memory:  WNL  Judgement:  Good  Insight:  Good  Psychomotor Activity:  Normal  Concentration:  Concentration: Good and Attention Span: Good  Recall:  Good  Fund of Knowledge: Good  Language: Good  Assets:  Communication Skills Desire for Improvement Financial Resources/Insurance Housing Resilience Transportation Vocational/Educational  ADL's:  Intact  Cognition: WNL  Prognosis:  Good    See note from endocrinology, [facility] physician, under care everywhere  DIAGNOSES:    ICD-10-CM   1. Generalized anxiety disorder  F41.1     2. PMDD (premenstrual dysphoric disorder)  F32.81       Receiving Psychotherapy: No   RECOMMENDATIONS:  PDMP was reviewed.  Ativan  filled 07/18/2022. I provided 22 minutes of face to face time during this encounter, including time spent before and after the visit in records  review, medical decision making, counseling pertinent to today's visit, and charting.   She's doing well so no changes are needed.   Continue Zoloft  50 mg daily with an increase to 75 mg daily the week before menstrual cycle begins. Return in 1 year.   Verneita Cooks,  PA-C

## 2023-09-14 ENCOUNTER — Encounter (INDEPENDENT_AMBULATORY_CARE_PROVIDER_SITE_OTHER): Payer: Self-pay

## 2023-09-16 ENCOUNTER — Ambulatory Visit: Payer: Commercial Managed Care - PPO | Admitting: Physician Assistant

## 2023-09-16 ENCOUNTER — Encounter: Payer: Self-pay | Admitting: Physician Assistant

## 2023-09-16 DIAGNOSIS — F3281 Premenstrual dysphoric disorder: Secondary | ICD-10-CM

## 2023-09-16 DIAGNOSIS — F411 Generalized anxiety disorder: Secondary | ICD-10-CM | POA: Diagnosis not present

## 2023-09-16 MED ORDER — SERTRALINE HCL 50 MG PO TABS: ORAL_TABLET | ORAL | 3 refills | Status: DC

## 2023-09-16 NOTE — Progress Notes (Signed)
 [facility]  Patient ID: Kaitlyn Todd,  MRN: [id number]  PCP: Aisha Harvey, MD  Date of Evaluation: 09/16/2023 Time spent:20 minutes  Chief Complaint:  Chief Complaint   Anxiety; Follow-up    HISTORY/CURRENT STATUS: HPI   For routine med check  Doing well w/ meds.  She is able to enjoy things.  Energy and motivation are good.  She does not cry easily.  No feelings of hopelessness reported.  Appetite is normal and weight is stable.  ADLs and personal hygiene are normal.  Anxiety is well controlled.  The increased dose of Zoloft  the week prior to menses does help.  No mania, delirium, psychosis, or SI/HI.    Individual Medical History/ Review of Systems: Changes? :Yes   more frequent migraines/headaches.  Will be seeing ENT soon and then may need to see a neurologist.  Past medications for mental health diagnoses include: Celexa, Zoloft   Allergies: Latex and Adhesive [tape]  Current Medications:  Current Outpatient Medications:    fluticasone (FLONASE) 50 MCG/ACT nasal spray, fluticasone propionate 50 mcg/actuation nasal spray,suspension, Disp: , Rfl:    ibuprofen  (ADVIL ,MOTRIN ) 800 MG tablet, Take 1 tablet (800 mg total) by mouth every 8 (eight) hours as needed., Disp: 50 tablet, Rfl: 1   ondansetron  (ZOFRAN -ODT) 4 MG disintegrating tablet, ondansetron  4 mg disintegrating tablet  PLACE 1 TABLET ON THE TONGUE AND ALLOW TO DISSOLVE AS NEEDED EVERY 4 HRS, Disp: , Rfl:    SUMAtriptan Succinate (IMITREX PO), , Disp: , Rfl:    Vitamin D, Ergocalciferol, (DRISDOL) 1.25 MG (50000 UNIT) CAPS capsule, Take 50,000 Units by mouth once a week., Disp: , Rfl:    LORazepam  (ATIVAN ) 0.5 MG tablet, Take 0.5-1 tablets (0.25-0.5 mg total) by mouth every 8 (eight) hours as needed for anxiety. (Patient not taking: Reported on 09/16/2023), Disp: 30 tablet, Rfl: 1   sertraline  (ZOLOFT ) 50 MG tablet, 1 TABLET EVERY DAY, 5 DAYS PRIOR TO MENSES, INCREASE TO 1.5, WHEN MENSES BEGINS, DECREASE TO 1 A  DAY, Disp: 105 tablet, Rfl: 3 Medication Side Effects: none  Family Medical/ Social History: Changes?  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  MENTAL HEALTH EXAM:  There were no vitals taken for this visit.There is no height or weight on file to calculate BMI.  General Appearance: Casual and Well Groomed  Eye Contact:  Good  Speech:  Clear and Coherent and Normal Rate  Volume:  Normal  Mood:  Euthymic  Affect:  Appropriate  Thought Process:  Goal Directed and Descriptions of Associations: Circumstantial  Orientation:  Full (Time, Place, and Person)  Thought Content: Logical   Suicidal Thoughts:  No  Homicidal Thoughts:  No  Memory:  WNL  Judgement:  Good  Insight:  Good  Psychomotor Activity:  Normal  Concentration:  Concentration: Good and Attention Span: Good  Recall:  Good  Fund of Knowledge: Good  Language: Good  Assets:  Communication Skills Desire for Improvement Financial Resources/Insurance Housing Physical Health Resilience Transportation Vocational/Educational  ADL's:  Intact  Cognition: WNL  Prognosis:  Good    See note from endocrinology, [facility], under care everywhere  DIAGNOSES:    ICD-10-CM   1. Generalized anxiety disorder  F41.1     2. PMDD (premenstrual dysphoric disorder)  F32.81      Receiving Psychotherapy: No   RECOMMENDATIONS:  PDMP was reviewed.  Ativan  filled 07/18/2022. I provided approximately 20  minutes of face to face time during this encounter, including time spent before and after the visit in records review, medical decision making, counseling pertinent to today's visit, and charting.   As far as her mental health is concerned she is doing well.  No changes need to be made.  She has lorazepam  but never takes.  I will leave it on medication list in case she does need it. Continue Zoloft  50 mg daily with an increase to 75 mg daily the  week before menstrual cycle begins. Return in 1 year.   Verneita Cooks, PA-C

## 2023-12-03 ENCOUNTER — Encounter (INDEPENDENT_AMBULATORY_CARE_PROVIDER_SITE_OTHER): Payer: Self-pay | Admitting: Otolaryngology

## 2023-12-03 ENCOUNTER — Ambulatory Visit (INDEPENDENT_AMBULATORY_CARE_PROVIDER_SITE_OTHER): Admitting: Otolaryngology

## 2023-12-03 VITALS — BP 135/85 | HR 86 | Temp 97.6°F

## 2023-12-03 DIAGNOSIS — J342 Deviated nasal septum: Secondary | ICD-10-CM

## 2023-12-03 DIAGNOSIS — R0981 Nasal congestion: Secondary | ICD-10-CM

## 2023-12-03 DIAGNOSIS — J343 Hypertrophy of nasal turbinates: Secondary | ICD-10-CM | POA: Diagnosis not present

## 2023-12-03 DIAGNOSIS — J324 Chronic pansinusitis: Secondary | ICD-10-CM

## 2023-12-03 DIAGNOSIS — J310 Chronic rhinitis: Secondary | ICD-10-CM

## 2023-12-06 DIAGNOSIS — J342 Deviated nasal septum: Secondary | ICD-10-CM | POA: Insufficient documentation

## 2023-12-06 DIAGNOSIS — J324 Chronic pansinusitis: Secondary | ICD-10-CM | POA: Insufficient documentation

## 2023-12-06 DIAGNOSIS — J343 Hypertrophy of nasal turbinates: Secondary | ICD-10-CM | POA: Insufficient documentation

## 2023-12-06 NOTE — Progress Notes (Signed)
 CC: Chronic rhinosinusitis, chronic nasal congestion  HPI:  Kaitlyn Todd is a 43 y.o. female who presents today complaining of chronic rhinosinusitis and chronic nasal congestion.  According to the patient, she has been symptomatic since childhood.  She has frequent headaches, facial pain and pressure, and nasal congestion.  Her symptoms are worse during the allergy seasons.  She also has a history of migraine headaches and environmental allergies.  She was treated with Flonase, Zyrtec, and Claritin as needed.  She underwent adenotonsillectomy as a child.  She has no history of nasal surgery.  Past Medical History:  Diagnosis Date   Anxiety    H/O candidiasis    H/O varicella    Heart murmur    Thyroid  dysfunction    Varicella    Yeast infection     Past Surgical History:  Procedure Laterality Date   TONSILLECTOMY     age 56   WISDOM TOOTH EXTRACTION  2010    Family History  Problem Relation Age of Onset   Hypertension Mother    Breast cancer Mother    Anxiety disorder Mother    Depression Father    Breast cancer Maternal Aunt    Depression Maternal Uncle    Alzheimer's disease Maternal Grandmother    Heart disease Maternal Grandfather    Cancer Paternal Grandmother    Parkinsonism Paternal Grandfather     Social History:  reports that she has never smoked. She has never used smokeless tobacco. She reports current alcohol use. She reports that she does not use drugs.  Allergies:  Allergies  Allergen Reactions   Latex Itching   Adhesive [Tape] Swelling    Prior to Admission medications   Medication Sig Start Date End Date Taking? Authorizing Provider  fluticasone (FLONASE) 50 MCG/ACT nasal spray fluticasone propionate 50 mcg/actuation nasal spray,suspension    [provider]  ibuprofen  (ADVIL ,MOTRIN ) 800 MG tablet Take 1 tablet (800 mg total) by mouth every 8 (eight) hours as needed. 04/03/15   Manda Fess, MD  LORazepam  (ATIVAN ) 0.5 MG tablet Take 0.5-1  tablets (0.25-0.5 mg total) by mouth every 8 (eight) hours as needed for anxiety. Patient not taking: Reported on 12/03/2023 07/18/22   Rhys Boyer T, PA-C  ondansetron  (ZOFRAN -ODT) 4 MG disintegrating tablet ondansetron  4 mg disintegrating tablet  PLACE 1 TABLET ON THE TONGUE AND ALLOW TO DISSOLVE AS NEEDED EVERY 4 HRS    [provider]  sertraline  (ZOLOFT ) 50 MG tablet 1 TABLET EVERY DAY, 5 DAYS PRIOR TO MENSES, INCREASE TO 1.5, WHEN MENSES BEGINS, DECREASE TO 1 A DAY 09/16/23   Hurst, Teresa T, PA-C  SUMAtriptan Succinate (IMITREX PO)     [provider]  Vitamin D, Ergocalciferol, (DRISDOL) 1.25 MG (50000 UNIT) CAPS capsule Take 50,000 Units by mouth once a week. 08/12/23   [provider]    Blood pressure 135/85, pulse 86, temperature 97.6 F (36.4 C), temperature source Oral, SpO2 98%. Exam: General: Communicates without difficulty, well nourished, no acute distress. Head: Normocephalic, no evidence injury, no tenderness, facial buttresses intact without stepoff. Face/sinus: No tenderness to palpation and percussion. Facial movement is normal and symmetric. Eyes: PERRL, EOMI. No scleral icterus, conjunctivae clear. Neuro: CN II exam reveals vision grossly intact.  No nystagmus at any point of gaze. Ears: Auricles well formed without lesions.  Ear canals are intact without mass or lesion.  No erythema or edema is appreciated.  The TMs are intact without fluid. Nose: External evaluation reveals normal support and skin without  lesions.  Dorsum is intact.  Anterior rhinoscopy reveals congested mucosa over anterior aspect of inferior turbinates and intact septum.  No purulence noted. Oral:  Oral cavity and oropharynx are intact, symmetric, without erythema or edema.  Mucosa is moist without lesions. Neck: Full range of motion without pain.  There is no significant lymphadenopathy.  No masses palpable.  Thyroid  bed within normal limits to palpation.  Parotid glands and  submandibular glands equal bilaterally without mass.  Trachea is midline. Neuro:  CN 2-12 grossly intact.   Procedure:  Flexible Nasal Endoscopy: Description: Risks, benefits, and alternatives of flexible endoscopy were explained to the patient.  Specific mention was made of the risk of throat numbness with difficulty swallowing, possible bleeding from the nose and mouth, and pain from the procedure.  The patient gave oral consent to proceed.  The flexible scope was inserted into the right nasal cavity.  Endoscopy of the interior nasal cavity, superior, inferior, and middle meatus was performed. The sphenoid-ethmoid recess was examined. Edematous mucosa was noted.  No polyp, mass, or lesion was appreciated. Nasal septal deviation noted. Olfactory cleft was clear.  Nasopharynx was clear.  Turbinates were hypertrophied but without mass.  The procedure was repeated on the contralateral side with similar findings.  The patient tolerated the procedure well.    Assessment: 1.  Chronic rhinosinusitis, with nasal mucosal congestion, nasal septal deviation, and bilateral inferior turbinate hypertrophy. 2.  No purulent drainage is noted today.  Plan: 1.  The physical exam and nasal endoscopy findings are reviewed with the patient. 2.  Flonase nasal spray 2 sprays each nostril daily.  The importance of consistent daily use is discussed. 3.  Nasal saline irrigation daily. 4.  Sinus CT scan to evaluate the extent of her chronic rhinosinusitis. 5.  The patient will return for reevaluation in 4 weeks.  Britainy Kozub W Michaelyn Wall 12/06/2023, 4:21 PM

## 2023-12-14 ENCOUNTER — Other Ambulatory Visit: Payer: Self-pay | Admitting: Otolaryngology

## 2023-12-24 ENCOUNTER — Ambulatory Visit (HOSPITAL_BASED_OUTPATIENT_CLINIC_OR_DEPARTMENT_OTHER)
Admission: RE | Admit: 2023-12-24 | Discharge: 2023-12-24 | Disposition: A | Discharge status: Home / Self Care | Source: Ambulatory Visit | Attending: Otolaryngology | Admitting: Otolaryngology

## 2023-12-24 DIAGNOSIS — J324 Chronic pansinusitis: Secondary | ICD-10-CM | POA: Diagnosis present

## 2023-12-29 ENCOUNTER — Other Ambulatory Visit: Payer: Self-pay | Admitting: Obstetrics and Gynecology

## 2023-12-29 DIAGNOSIS — Z1231 Encounter for screening mammogram for malignant neoplasm of breast: Secondary | ICD-10-CM

## 2023-12-31 ENCOUNTER — Ambulatory Visit (INDEPENDENT_AMBULATORY_CARE_PROVIDER_SITE_OTHER): Admitting: Otolaryngology

## 2024-01-11 NOTE — Progress Notes (Signed)
 Surgical Instructions   Your procedure is scheduled on Thursday, October 30th. Report to [facility] Main Entrance A at 10:00 A.M., then check in with the Admitting office. Any questions or running late day of surgery: call [phone]  Questions prior to your surgery date: call [phone], Monday-Friday, 8am-4pm. If you experience any cold or flu symptoms such as cough, fever, chills, shortness of breath, etc. between now and your scheduled surgery, please notify us  at the above number.     Remember:  Do not eat after midnight the night before your surgery   You may drink clear liquids until 9:00 the morning of your surgery.   Clear liquids allowed are: Water, Non-Citrus Juices (without pulp), Carbonated Beverages, Clear Tea (no milk, honey, etc.), Black Coffee Only (NO MILK, CREAM OR POWDERED CREAMER of any kind), and Gatorade.    Take these medicines the morning of surgery with A SIP OF WATER  levothyroxine (SYNTHROID)    May take these medicines IF NEEDED: fluticasone (FLONASE)  ondansetron  (ZOFRAN -ODT)  sertraline  (ZOLOFT )  SUMAtriptan (IMITREX)    One week prior to surgery, STOP taking any Aspirin (unless otherwise instructed by your surgeon) Aleve, Naproxen, Goody's, BC's, all herbal medications, fish oil, and non-prescription vitamins. This includes your ibuprofen  (ADVIL ,MOTRIN ).                      Do NOT Smoke (Tobacco/Vaping) for 24 hours prior to your procedure.  If you use a CPAP at night, you may bring your mask/headgear for your overnight stay.   You will be asked to remove any contacts, glasses, piercing's, hearing aid's, dentures/partials prior to surgery. Please bring cases for these items if needed.    Patients discharged the day of surgery will not be allowed to drive home, and someone needs to stay with them for 24 hours.  SURGICAL WAITING ROOM VISITATION Patients may have no more than 2 support people in the waiting area - these visitors may  rotate.   Pre-op nurse will coordinate an appropriate time for 1 ADULT support person, who may not rotate, to accompany patient in pre-op.  Children under the age of 18 must have an adult with them who is not the patient and must remain in the main waiting area with an adult.  If the patient needs to stay at the hospital during part of their recovery, the visitor guidelines for inpatient rooms apply.  Please refer to the [facility] website for the visitor guidelines for any additional information.   If you received a COVID test during your pre-op visit  it is requested that you wear a mask when out in public, stay away from anyone that may not be feeling well and notify your surgeon if you develop symptoms. If you have been in contact with anyone that has tested positive in the last 10 days please notify you surgeon.      Pre-operative CHG Bathing Instructions   You can play a key role in reducing the risk of infection after surgery. Your skin needs to be as free of germs as possible. You can reduce the number of germs on your skin by washing with CHG (chlorhexidine gluconate) soap before surgery. CHG is an antiseptic soap that kills germs and continues to kill germs even after washing.   DO NOT use if you have an allergy to chlorhexidine/CHG or antibacterial soaps. If your skin becomes reddened or irritated, stop using the CHG and notify one of our RNs at [phone].  TAKE A SHOWER THE NIGHT BEFORE SURGERY   Please keep in mind the following:  DO NOT shave, including legs and underarms, 48 hours prior to surgery.   Place clean sheets on your bed the night before surgery Use a clean washcloth (not used since being washed) for shower. DO NOT sleep with pet's night before surgery.  CHG Shower Instructions:  Wash your face and private area with normal soap. If you choose to wash your hair, wash first with your normal shampoo.  After you use shampoo/soap, rinse your hair and  body thoroughly to remove shampoo/soap residue.  Turn the water OFF and apply half the bottle of CHG soap to a CLEAN washcloth.  Apply CHG soap ONLY FROM YOUR NECK DOWN TO YOUR TOES (washing for 3-5 minutes)  DO NOT use CHG soap on face, private areas, open wounds, or sores.  Pay special attention to the area where your surgery is being performed.  If you are having back surgery, having someone wash your back for you may be helpful. Wait 2 minutes after CHG soap is applied, then you may rinse off the CHG soap.  Pat dry with a clean towel  Put on clean pajamas    Additional instructions for the day of surgery: If you choose, you may shower the morning of surgery with an antibacterial soap.  DO NOT APPLY any lotions, deodorants, or perfumes.   Do not wear jewelry or makeup Do not wear nail polish, gel polish, artificial nails, or any other type of covering on natural nails (fingers and toes) Do not bring valuables to the hospital. [facility] is not responsible for valuables/personal belongings. Put on clean/comfortable clothes.  Please brush your teeth.  Ask your nurse before applying any prescription medications to the skin.

## 2024-01-12 ENCOUNTER — Other Ambulatory Visit: Payer: Self-pay

## 2024-01-12 ENCOUNTER — Encounter (HOSPITAL_COMMUNITY): Payer: Self-pay

## 2024-01-12 ENCOUNTER — Ambulatory Visit (INDEPENDENT_AMBULATORY_CARE_PROVIDER_SITE_OTHER): Admitting: Otolaryngology

## 2024-01-12 ENCOUNTER — Encounter (HOSPITAL_COMMUNITY)
Admission: RE | Admit: 2024-01-12 | Discharge: 2024-01-12 | Disposition: A | Discharge status: Home / Self Care | Source: Ambulatory Visit | Attending: Otolaryngology | Admitting: Otolaryngology

## 2024-01-12 VITALS — BP 159/97 | HR 98 | Temp 97.9°F | Resp 16 | Ht 66.0 in | Wt 201.3 lb

## 2024-01-12 DIAGNOSIS — Z01812 Encounter for preprocedural laboratory examination: Secondary | ICD-10-CM | POA: Insufficient documentation

## 2024-01-12 DIAGNOSIS — Z01818 Encounter for other preprocedural examination: Secondary | ICD-10-CM

## 2024-01-12 HISTORY — DX: Nausea with vomiting, unspecified: R11.2

## 2024-01-12 HISTORY — DX: Headache, unspecified: R51.9

## 2024-01-12 LAB — CBC
HCT: 43.2 % (ref 36.0–46.0)
Hemoglobin: 14.6 g/dL (ref 12.0–15.0)
MCH: 27.5 pg (ref 26.0–34.0)
MCHC: 33.8 g/dL (ref 30.0–36.0)
MCV: 81.5 fL (ref 80.0–100.0)
Platelets: 313 K/uL (ref 150–400)
RBC: 5.3 MIL/uL — ABNORMAL HIGH (ref 3.87–5.11)
RDW: 12.2 % (ref 11.5–15.5)
WBC: 10 K/uL (ref 4.0–10.5)
nRBC: 0 % (ref 0.0–0.2)

## 2024-01-12 LAB — BASIC METABOLIC PANEL WITH GFR
Anion gap: 9 (ref 5–15)
BUN: 9 mg/dL (ref 6–20)
CO2: 23 mmol/L (ref 22–32)
Calcium: 9 mg/dL (ref 8.9–10.3)
Chloride: 104 mmol/L (ref 98–111)
Creatinine, Ser: 0.98 mg/dL (ref 0.44–1.00)
GFR, Estimated: >60 mL/min (ref >60)
Glucose, Bld: 100 mg/dL — ABNORMAL HIGH (ref 70–99)
Potassium: 3.6 mmol/L (ref 3.5–5.1)
Sodium: 136 mmol/L (ref 135–145)

## 2024-01-12 NOTE — Progress Notes (Signed)
 Surgical Instructions   Your procedure is scheduled on Thursday, October 30th. Report to [facility] Main Entrance A at 10:00 A.M., then check in with the Admitting office. Any questions or running late day of surgery: call [phone]  Questions prior to your surgery date: call [phone], Monday-Friday, 8am-4pm. If you experience any cold or flu symptoms such as cough, fever, chills, shortness of breath, etc. between now and your scheduled surgery, please notify us  at the above number.     Remember:  Do not eat after midnight the night before your surgery   You may drink clear liquids until 9:00 the morning of your surgery.   Clear liquids allowed are: Water, Non-Citrus Juices (without pulp), Carbonated Beverages, Clear Tea (no milk, honey, etc.), Black Coffee Only (NO MILK, CREAM OR POWDERED CREAMER of any kind), and Gatorade.    Take these medicines the morning of surgery with A SIP OF WATER  sertraline  (ZOLOFT )   May take these medicines IF NEEDED: fluticasone (FLONASE)  ondansetron  (ZOFRAN -ODT)        SUMAtriptan (IMITREX)    One week prior to surgery, STOP taking any Aspirin (unless otherwise instructed by your surgeon) Aleve, Naproxen, Goody's, BC's, all herbal medications, fish oil, and non-prescription vitamins. This includes your ibuprofen  (ADVIL ,MOTRIN ).                      Do NOT Smoke (Tobacco/Vaping) for 24 hours prior to your procedure.  If you use a CPAP at night, you may bring your mask/headgear for your overnight stay.   You will be asked to remove any contacts, glasses, piercing's, hearing aid's, dentures/partials prior to surgery. Please bring cases for these items if needed.    Patients discharged the day of surgery will not be allowed to drive home, and someone needs to stay with them for 24 hours.  SURGICAL WAITING ROOM VISITATION Patients may have no more than 2 support people in the waiting area - these visitors may rotate.   Pre-op nurse will  coordinate an appropriate time for 1 ADULT support person, who may not rotate, to accompany patient in pre-op.  Children under the age of 101 must have an adult with them who is not the patient and must remain in the main waiting area with an adult.  If the patient needs to stay at the hospital during part of their recovery, the visitor guidelines for inpatient rooms apply.  Please refer to the [facility] website for the visitor guidelines for any additional information.   If you received a COVID test during your pre-op visit  it is requested that you wear a mask when out in public, stay away from anyone that may not be feeling well and notify your surgeon if you develop symptoms. If you have been in contact with anyone that has tested positive in the last 10 days please notify you surgeon.      Pre-operative CHG Bathing Instructions   You can play a key role in reducing the risk of infection after surgery. Your skin needs to be as free of germs as possible. You can reduce the number of germs on your skin by washing with CHG (chlorhexidine gluconate) soap before surgery. CHG is an antiseptic soap that kills germs and continues to kill germs even after washing.   DO NOT use if you have an allergy to chlorhexidine/CHG or antibacterial soaps. If your skin becomes reddened or irritated, stop using the CHG and notify one of our RNs  at [phone].              TAKE A SHOWER THE NIGHT BEFORE SURGERY   Please keep in mind the following:  DO NOT shave, including legs and underarms, 48 hours prior to surgery.   Place clean sheets on your bed the night before surgery Use a clean washcloth (not used since being washed) for shower. DO NOT sleep with pet's night before surgery.  CHG Shower Instructions:  Wash your face and private area with normal soap. If you choose to wash your hair, wash first with your normal shampoo.  After you use shampoo/soap, rinse your hair and body thoroughly to remove  shampoo/soap residue.  Turn the water OFF and apply half the bottle of CHG soap to a CLEAN washcloth.  Apply CHG soap ONLY FROM YOUR NECK DOWN TO YOUR TOES (washing for 3-5 minutes)  DO NOT use CHG soap on face, private areas, open wounds, or sores.  Pay special attention to the area where your surgery is being performed.  If you are having back surgery, having someone wash your back for you may be helpful. Wait 2 minutes after CHG soap is applied, then you may rinse off the CHG soap.  Pat dry with a clean towel  Put on clean pajamas    Additional instructions for the day of surgery: If you choose, you may shower the morning of surgery with an antibacterial soap.  DO NOT APPLY any lotions, deodorants, or perfumes.   Do not wear jewelry or makeup Do not wear nail polish, gel polish, artificial nails, or any other type of covering on natural nails (fingers and toes) Do not bring valuables to the hospital. [facility] is not responsible for valuables/personal belongings. Put on clean/comfortable clothes.  Please brush your teeth.  Ask your nurse before applying any prescription medications to the skin.

## 2024-01-13 ENCOUNTER — Ambulatory Visit

## 2024-01-13 NOTE — Anesthesia Preprocedure Evaluation (Signed)
 Anesthesia Evaluation  Patient identified by MRN, date of birth, ID band Patient awake    Reviewed: Allergy & Precautions, NPO status , Patient's Chart, lab work & pertinent test results  History of Anesthesia Complications (+) PONV and history of anesthetic complications  Airway Mallampati: II  TM Distance: >3 FB Neck ROM: Full    Dental no notable dental hx. (+) Teeth Intact, Dental Advisory Given   Pulmonary neg pulmonary ROS   Pulmonary exam normal breath sounds clear to auscultation       Cardiovascular (-) hypertension(-) angina (-) Past MI Normal cardiovascular exam Rhythm:Regular Rate:Normal     Neuro/Psych  Headaches  Anxiety        GI/Hepatic negative GI ROS, Neg liver ROS,,,  Endo/Other  negative endocrine ROS    Renal/GU negative Renal ROSLab Results      Component                Value               Date                               K                        3.6                 01/12/2024                CREATININE               0.98                01/12/2024                       GLUCOSE                  100 (H)             01/12/2024                Musculoskeletal   Abdominal   Peds  Hematology Lab Results      Component                Value               Date                      WBC                      10.0                01/12/2024                HGB                      14.6                01/12/2024                HCT                      43.2                01/12/2024                MCV  81.5                01/12/2024                PLT                      313                 01/12/2024              Anesthesia Other Findings All: latex  Reproductive/Obstetrics negative OB ROS                              Anesthesia Physical Anesthesia Plan  ASA: 1  Anesthesia Plan: General   Post-op Pain Management: Ofirmev  IV (intra-op)*   Induction:  Intravenous  PONV Risk Score and Plan: 4 or greater and Treatment may vary due to age or medical condition, Midazolam, Dexamethasone, Ondansetron  and Scopolamine patch - Pre-op  Airway Management Planned: Oral ETT  Additional Equipment: None  Intra-op Plan:   Post-operative Plan: Extubation in OR  Informed Consent: I have reviewed the patients History and Physical, chart, labs and discussed the procedure including the risks, benefits and alternatives for the proposed anesthesia with the patient or authorized representative who has indicated his/her understanding and acceptance.     Dental advisory given  Plan Discussed with: CRNA and Surgeon  Anesthesia Plan Comments:          Anesthesia Quick Evaluation

## 2024-01-14 ENCOUNTER — Observation Stay (HOSPITAL_COMMUNITY)
Admission: RE | Admit: 2024-01-14 | Discharge: 2024-01-15 | Disposition: A | Discharge status: Home / Self Care | Attending: Otolaryngology | Admitting: Otolaryngology

## 2024-01-14 ENCOUNTER — Encounter (HOSPITAL_COMMUNITY): Payer: Self-pay | Admitting: Otolaryngology

## 2024-01-14 ENCOUNTER — Encounter (HOSPITAL_COMMUNITY)
Admission: RE | Disposition: A | Discharge status: Home / Self Care | Payer: Self-pay | Source: Home / Self Care | Attending: Otolaryngology

## 2024-01-14 ENCOUNTER — Ambulatory Visit (HOSPITAL_COMMUNITY): Payer: Self-pay | Admitting: Anesthesiology

## 2024-01-14 ENCOUNTER — Other Ambulatory Visit: Payer: Self-pay

## 2024-01-14 ENCOUNTER — Encounter (HOSPITAL_COMMUNITY): Payer: Self-pay | Admitting: Anesthesiology

## 2024-01-14 DIAGNOSIS — E042 Nontoxic multinodular goiter: Secondary | ICD-10-CM | POA: Diagnosis present

## 2024-01-14 DIAGNOSIS — Z91040 Latex allergy status: Secondary | ICD-10-CM | POA: Diagnosis not present

## 2024-01-14 HISTORY — PX: THYROIDECTOMY: SHX17

## 2024-01-14 LAB — POCT PREGNANCY, URINE: Preg Test, Ur: NEGATIVE

## 2024-01-14 LAB — CALCIUM: Calcium: 7.6 mg/dL — ABNORMAL LOW (ref 8.9–10.3)

## 2024-01-14 SURGERY — THYROIDECTOMY
Anesthesia: General | Laterality: Bilateral

## 2024-01-14 MED ORDER — LACTATED RINGERS IV SOLN: INTRAVENOUS | Status: DC

## 2024-01-14 MED ORDER — SUMATRIPTAN SUCCINATE 25 MG PO TABS: 25.0000 mg | ORAL_TABLET | ORAL | Status: DC | PRN

## 2024-01-14 MED ORDER — PHENYLEPHRINE 80 MCG/ML (10ML) SYRINGE FOR IV PUSH (FOR BLOOD PRESSURE SUPPORT)
PREFILLED_SYRINGE | INTRAVENOUS | Status: DC | PRN
  Administered 2024-01-14 (×3): 80 ug via INTRAVENOUS

## 2024-01-14 MED ORDER — FENTANYL CITRATE (PF) 250 MCG/5ML IJ SOLN
INTRAMUSCULAR | Status: DC | PRN
  Administered 2024-01-14: 50 ug via INTRAVENOUS
  Administered 2024-01-14: 100 ug via INTRAVENOUS
  Administered 2024-01-14 (×2): 50 ug via INTRAVENOUS

## 2024-01-14 MED ORDER — HYDROCODONE-ACETAMINOPHEN 5-325 MG PO TABS
1.0000 | ORAL_TABLET | ORAL | Status: DC | PRN
  Filled 2024-01-14: qty 1

## 2024-01-14 MED ORDER — FENTANYL CITRATE (PF) 250 MCG/5ML IJ SOLN
INTRAMUSCULAR | Status: AC
  Filled 2024-01-14: qty 5

## 2024-01-14 MED ORDER — 0.9 % SODIUM CHLORIDE (POUR BTL) OPTIME
TOPICAL | Status: DC | PRN
  Administered 2024-01-14: 1000 mL

## 2024-01-14 MED ORDER — SCOPOLAMINE 1 MG/3DAYS TD PT72
1.0000 | MEDICATED_PATCH | TRANSDERMAL | Status: DC
  Administered 2024-01-14: 1 mg via TRANSDERMAL
  Filled 2024-01-14: qty 1

## 2024-01-14 MED ORDER — DEXMEDETOMIDINE HCL IN NACL 80 MCG/20ML IV SOLN
INTRAVENOUS | Status: DC | PRN
  Administered 2024-01-14: 8 ug via INTRAVENOUS

## 2024-01-14 MED ORDER — ONDANSETRON HCL 4 MG/2ML IJ SOLN
INTRAMUSCULAR | Status: DC | PRN
Start: 2024-01-14 — End: 2024-01-14
  Administered 2024-01-14: 4 mg via INTRAVENOUS

## 2024-01-14 MED ORDER — ACETAMINOPHEN 10 MG/ML IV SOLN
INTRAVENOUS | Status: AC
  Filled 2024-01-14: qty 100

## 2024-01-14 MED ORDER — CEFAZOLIN SODIUM-DEXTROSE 2-4 GM/100ML-% IV SOLN
2.0000 g | INTRAVENOUS | Status: AC
  Administered 2024-01-14: 2 g via INTRAVENOUS
  Filled 2024-01-14: qty 100

## 2024-01-14 MED ORDER — LORAZEPAM 2 MG/ML IJ SOLN: 0.5000 mg | Freq: Four times a day (QID) | INTRAMUSCULAR | Status: DC | PRN

## 2024-01-14 MED ORDER — KETOROLAC TROMETHAMINE 30 MG/ML IJ SOLN: 30.0000 mg | Freq: Once | INTRAMUSCULAR | Status: DC | PRN

## 2024-01-14 MED ORDER — PHENYLEPHRINE HCL-NACL 20-0.9 MG/250ML-% IV SOLN
INTRAVENOUS | Status: DC | PRN
  Administered 2024-01-14: 30 ug/min via INTRAVENOUS

## 2024-01-14 MED ORDER — LIDOCAINE-EPINEPHRINE 1 %-1:100000 IJ SOLN
INTRAMUSCULAR | Status: DC | PRN
  Administered 2024-01-14: 30 mL via INTRADERMAL

## 2024-01-14 MED ORDER — CHLORHEXIDINE GLUCONATE 0.12 % MT SOLN
15.0000 mL | Freq: Once | OROMUCOSAL | Status: AC
Start: 2024-01-14 — End: 2024-01-14
  Administered 2024-01-14: 15 mL via OROMUCOSAL
  Filled 2024-01-14: qty 15

## 2024-01-14 MED ORDER — ONDANSETRON HCL 4 MG/2ML IJ SOLN: 4.0000 mg | Freq: Once | INTRAMUSCULAR | Status: DC | PRN

## 2024-01-14 MED ORDER — ROCURONIUM BROMIDE 10 MG/ML (PF) SYRINGE
PREFILLED_SYRINGE | INTRAVENOUS | Status: AC
  Filled 2024-01-14: qty 10

## 2024-01-14 MED ORDER — MORPHINE SULFATE (PF) 2 MG/ML IV SOLN: 2.0000 mg | INTRAVENOUS | Status: DC | PRN

## 2024-01-14 MED ORDER — ACETAMINOPHEN 10 MG/ML IV SOLN
INTRAVENOUS | Status: DC | PRN
Start: 2024-01-14 — End: 2024-01-14
  Administered 2024-01-14: 1000 mg via INTRAVENOUS

## 2024-01-14 MED ORDER — DEXAMETHASONE SOD PHOSPHATE PF 10 MG/ML IJ SOLN
INTRAMUSCULAR | Status: DC | PRN
  Administered 2024-01-14: 10 mg via INTRAVENOUS

## 2024-01-14 MED ORDER — SUCCINYLCHOLINE CHLORIDE 200 MG/10ML IV SOSY
PREFILLED_SYRINGE | INTRAVENOUS | Status: DC | PRN
  Administered 2024-01-14: 100 mg via INTRAVENOUS

## 2024-01-14 MED ORDER — LIDOCAINE 2% (20 MG/ML) 5 ML SYRINGE
INTRAMUSCULAR | Status: DC | PRN
  Administered 2024-01-14: 100 mg via INTRAVENOUS

## 2024-01-14 MED ORDER — MIDAZOLAM HCL (PF) 2 MG/2ML IJ SOLN
INTRAMUSCULAR | Status: DC | PRN
  Administered 2024-01-14: 2 mg via INTRAVENOUS

## 2024-01-14 MED ORDER — HYDROMORPHONE HCL 1 MG/ML IJ SOLN
INTRAMUSCULAR | Status: AC
  Filled 2024-01-14: qty 1

## 2024-01-14 MED ORDER — OXYCODONE HCL 5 MG/5ML PO SOLN: 5.0000 mg | Freq: Once | ORAL | Status: DC | PRN

## 2024-01-14 MED ORDER — LEVOTHYROXINE SODIUM 25 MCG PO TABS
137.0000 ug | ORAL_TABLET | Freq: Every day | ORAL | Status: DC
  Administered 2024-01-15: 137 ug via ORAL
  Filled 2024-01-14: qty 1

## 2024-01-14 MED ORDER — OXYCODONE HCL 5 MG PO TABS: 5.0000 mg | ORAL_TABLET | Freq: Once | ORAL | Status: DC | PRN

## 2024-01-14 MED ORDER — LACTATED RINGERS IV SOLN: INTRAVENOUS | Status: DC | PRN

## 2024-01-14 MED ORDER — PROPOFOL 10 MG/ML IV BOLUS
INTRAVENOUS | Status: AC
Start: 2024-01-14 — End: 2024-01-14
  Filled 2024-01-14: qty 20

## 2024-01-14 MED ORDER — LIDOCAINE 2% (20 MG/ML) 5 ML SYRINGE
INTRAMUSCULAR | Status: AC
  Filled 2024-01-14: qty 5

## 2024-01-14 MED ORDER — MIDAZOLAM HCL 2 MG/2ML IJ SOLN
INTRAMUSCULAR | Status: AC
  Filled 2024-01-14: qty 2

## 2024-01-14 MED ORDER — STERILE WATER FOR IRRIGATION IR SOLN
Status: DC | PRN
  Administered 2024-01-14: 1000 mL

## 2024-01-14 MED ORDER — ONDANSETRON HCL 4 MG/2ML IJ SOLN
INTRAMUSCULAR | Status: AC
  Filled 2024-01-14: qty 2

## 2024-01-14 MED ORDER — ONDANSETRON 4 MG PO TBDP: 4.0000 mg | ORAL_TABLET | Freq: Three times a day (TID) | ORAL | Status: DC | PRN

## 2024-01-14 MED ORDER — IBUPROFEN 600 MG PO TABS
600.0000 mg | ORAL_TABLET | Freq: Four times a day (QID) | ORAL | Status: DC | PRN
  Administered 2024-01-14: 600 mg via ORAL
  Filled 2024-01-14: qty 1

## 2024-01-14 MED ORDER — PROPOFOL 10 MG/ML IV BOLUS
INTRAVENOUS | Status: DC | PRN
  Administered 2024-01-14: 150 mg via INTRAVENOUS

## 2024-01-14 MED ORDER — LIDOCAINE-EPINEPHRINE 1 %-1:100000 IJ SOLN
INTRAMUSCULAR | Status: AC
  Filled 2024-01-14: qty 1

## 2024-01-14 MED ORDER — HYDROMORPHONE HCL 1 MG/ML IJ SOLN
0.2500 mg | INTRAMUSCULAR | Status: DC | PRN
  Administered 2024-01-14: 0.25 mg via INTRAVENOUS

## 2024-01-14 MED ORDER — ORAL CARE MOUTH RINSE: 15.0000 mL | Freq: Once | OROMUCOSAL | Status: AC

## 2024-01-14 MED ORDER — KCL IN DEXTROSE-NACL 20-5-0.45 MEQ/L-%-% IV SOLN
INTRAVENOUS | Status: DC
  Filled 2024-01-14 (×2): qty 1000

## 2024-01-14 SURGICAL SUPPLY — 43 items
BAG COUNTER SPONGE SURGICOUNT (BAG) ×1 IMPLANT
BLADE SURG 15 STRL LF DISP TIS (BLADE) IMPLANT
CANISTER SUCTION 3000ML PPV (SUCTIONS) ×1 IMPLANT
CLEANER TIP ELECTROSURG 2X2 (MISCELLANEOUS) ×1 IMPLANT
CLIP TI MEDIUM 6 (CLIP) IMPLANT
CNTNR URN SCR LID CUP LEK RST (MISCELLANEOUS) IMPLANT
CORD BIPOLAR FORCEPS 12FT (ELECTRODE) ×1 IMPLANT
COVER SURGICAL LIGHT HANDLE (MISCELLANEOUS) ×1 IMPLANT
DERMABOND ADVANCED .7 DNX12 (GAUZE/BANDAGES/DRESSINGS) ×1 IMPLANT
DRAIN 1/8 RD END PERF LFSIL ST (DRAIN) IMPLANT
DRAIN WOUND RND W/TROCAR (DRAIN) IMPLANT
DRAPE HALF SHEET 40X57 (DRAPES) IMPLANT
ELECT COATED BLADE 2.86 ST (ELECTRODE) ×1 IMPLANT
ELECTRODE REM PT RTRN 9FT ADLT (ELECTROSURGICAL) ×1 IMPLANT
EVACUATOR SILICONE 100CC (DRAIN) ×1 IMPLANT
FORCEPS BIPOLAR SPETZLER 8 1.0 (NEUROSURGERY SUPPLIES) ×1 IMPLANT
GAUZE 4X4 16PLY ~~LOC~~+RFID DBL (SPONGE) ×1 IMPLANT
GLOVE BIO SURGEON STRL SZ7.5 (GLOVE) ×1 IMPLANT
GOWN STRL REUS W/ TWL LRG LVL3 (GOWN DISPOSABLE) ×2 IMPLANT
HEMOSTAT SURGICEL 2X14 (HEMOSTASIS) ×1 IMPLANT
HEMOSTAT SURGICEL 4X8 (HEMOSTASIS) IMPLANT
KIT BASIN OR (CUSTOM PROCEDURE TRAY) ×1 IMPLANT
KIT TURNOVER KIT B (KITS) ×1 IMPLANT
LOCATOR NERVE 3 VOLT (DISPOSABLE) IMPLANT
NDL HYPO 25GX1X1/2 BEV (NEEDLE) ×1 IMPLANT
NEEDLE HYPO 25GX1X1/2 BEV (NEEDLE) ×1 IMPLANT
PAD ARMBOARD POSITIONER FOAM (MISCELLANEOUS) ×2 IMPLANT
PENCIL SMOKE EVACUATOR (MISCELLANEOUS) ×1 IMPLANT
POSITIONER HEAD DONUT 9IN (MISCELLANEOUS) IMPLANT
PROBE NERVBE PRASS .33 (MISCELLANEOUS) IMPLANT
SET WALTER ACTIVATION W/DRAPE (SET/KITS/TRAYS/PACK) IMPLANT
SHEARS HARMONIC 9CM CVD (BLADE) ×1 IMPLANT
SOLN 0.9% NACL POUR BTL 1000ML (IV SOLUTION) ×1 IMPLANT
SPONGE INTESTINAL PEANUT (DISPOSABLE) IMPLANT
STAPLER SKIN PROX 35W (STAPLE) ×1 IMPLANT
SUT ETHILON 2 0 FS 18 (SUTURE) ×1 IMPLANT
SUT SILK 3 0 REEL (SUTURE) ×1 IMPLANT
SUT VIC AB 3-0 SH 27X BRD (SUTURE) ×1 IMPLANT
SUT VIC AB 4-0 PS2 18 (SUTURE) ×1 IMPLANT
TOWEL GREEN STERILE FF (TOWEL DISPOSABLE) ×1 IMPLANT
TRAY ENT MC OR (CUSTOM PROCEDURE TRAY) ×1 IMPLANT
TUBE 10FR 43IN 5G WT TIP ENFIT (TUBING) IMPLANT
TUBE ENDOTRAC NIMS EMG 7MM (MISCELLANEOUS) IMPLANT

## 2024-01-14 NOTE — Anesthesia Procedure Notes (Signed)
 Procedure Name: Intubation Date/Time: 01/14/2024 12:29 PM  Performed by: Wylan Gentzler C, CRNAPre-anesthesia Checklist: Patient identified, Emergency Drugs available, Suction available and Patient being monitored Patient Re-evaluated:Patient Re-evaluated prior to induction Oxygen Delivery Method: Circle system utilized Preoxygenation: Pre-oxygenation with 100% oxygen Induction Type: IV induction Ventilation: Mask ventilation without difficulty Laryngoscope Size: Mac and 3 Grade View: Grade II Tube type: Oral Tube size: 7.0 mm Number of attempts: 1 Airway Equipment and Method: Stylet and Oral airway Placement Confirmation: ETT inserted through vocal cords under direct vision, positive ETCO2 and breath sounds checked- equal and bilateral Secured at: 22 cm Tube secured with: Tape Dental Injury: Teeth and Oropharynx as per pre-operative assessment  Comments: NIMS ETT used . Tested for accuracy reading by Surgeon.

## 2024-01-14 NOTE — Op Note (Signed)
 Preop diagnosis: Multinodular thyroid  goiter Postop diagnosis: same Procedure: Total thyroidectomy Surgeon: Carlie Assist: RNFA Anesth: General endotracheal and local with 1% lidocaine  with 1:100,000 epinephrine Compl: None Findings: Enlarged thyroid  gland Description:  After discussing risks, benefits, and alternatives, the patient was brought to the operative suite and placed on the operative table in the supine position.  Anesthesia was induced and the patient was intubated using a NIM endotracheal tube by the Anesthesia team without difficulty.  The eyes were taped closed.  The thyroid  incision was marked with a marking pen and injected with local anesthetic.  The neck was prepped and draped in sterile fashion.  The incision was made with a 15 blade scalpel and extended through the subcutaneous and platysma layers with Bovie electrocautery.  The midline raphe of the strap muscles was divided and strap muscles retracted over the left lobe first.  Dissection was then performed around the lobe using the harmonic scalpel includig division of the superior and inferior pedicles and dissection around the lateral aspect.  The superior parathyroid gland was visualized and left in place.  Dissection continued around the deep aspect of the lobe while retracting the lobe.  Staying along the capsule, the dissection continued to Berry's ligament that was dissected.  The recurrent laryngeal nerve was visualized, kept intact, and confirmed with the nerve stimulator.  The right lobe was then dissected in the same fashion.  On the right, the superior and inferior parathyroid glands were visualized and kept in place.  The recurrent laryngeal nerve was visualized, kept intact, and confirmed with the nerve stimulator.  The gland was fully removed and was passed to nursing for pathology.   Bleeding was controlled with Bipolar electrocautery after a Valsalva was given.  After irrigating copiously, the strap muscles were closed  loosely with a 3-0 Vicryl suture.  The platysma layer was closed with 3-0 Vicryl in a simple, interrupted fashion.  The subcutaneous layer was similarly closed with 4-0 Vicryl and the skin was closed with glue.  Drapes were removed and the patient was cleaned off.  She was returned to anesthesia for wake-up, was extubated, and moved to the recovery room in stable condition.

## 2024-01-14 NOTE — Anesthesia Postprocedure Evaluation (Signed)
 Anesthesia Post Note  Patient: Kaitlyn Todd  Procedure(s) Performed: TOTAL THYROIDECTOMY (Bilateral)     Patient location during evaluation: PACU Anesthesia Type: General Level of consciousness: awake and alert Pain management: pain level controlled Vital Signs Assessment: post-procedure vital signs reviewed and stable Respiratory status: spontaneous breathing, nonlabored ventilation, respiratory function stable and patient connected to nasal cannula oxygen Cardiovascular status: blood pressure returned to baseline and stable Postop Assessment: no apparent nausea or vomiting Anesthetic complications: no   No notable events documented.  Last Vitals:  Vitals:   01/14/24 1630 01/14/24 1645  BP: 128/77 133/85  Pulse: 85 85  Resp: 17 18  Temp:    SpO2: 94% 94%    Last Pain:  Vitals:   01/14/24 1645  TempSrc:   PainSc: 0-No pain   Pain Goal: Patients Stated Pain Goal: 3 (01/14/24 1617)                 Garnette DELENA Gab

## 2024-01-14 NOTE — H&P (Signed)
 Kaitlyn Todd is an 43 y.o. female.   Chief Complaint: Thyroid  goiter HPI: 43 year old female with long history of thyroid  goiter with varying hyperthyroid symptoms and recent increase in size.  She has elected to proceed with surgical removal.  Past Medical History:  Diagnosis Date   Anxiety    H/O candidiasis    H/O varicella    Headache    PONV (postoperative nausea and vomiting)    Thyroid  dysfunction    Varicella    Yeast infection     Past Surgical History:  Procedure Laterality Date   ABLATION     uterine adenomyoma   CARPAL TUNNEL RELEASE Bilateral 2024   TONSILLECTOMY     age 31   WISDOM TOOTH EXTRACTION  03/17/2008    Family History  Problem Relation Age of Onset   Hypertension Mother    Breast cancer Mother    Anxiety disorder Mother    Depression Father    Breast cancer Maternal Aunt    Depression Maternal Uncle    Alzheimer's disease Maternal Grandmother    Heart disease Maternal Grandfather    Cancer Paternal Grandmother    Parkinsonism Paternal Grandfather    Social History:  reports that she has never smoked. She has never used smokeless tobacco. She reports current alcohol use. She reports that she does not use drugs.  Allergies:  Allergies  Allergen Reactions   Latex Itching   Adhesive [Tape] Swelling    Medications Prior to Admission  Medication Sig Dispense Refill   fluticasone (FLONASE) 50 MCG/ACT nasal spray Place 2 sprays into both nostrils daily as needed for allergies.     ondansetron  (ZOFRAN -ODT) 4 MG disintegrating tablet Take 4 mg by mouth every 8 (eight) hours as needed for vomiting or nausea.     sertraline  (ZOLOFT ) 50 MG tablet 1 TABLET EVERY DAY, 5 DAYS PRIOR TO MENSES, INCREASE TO 1.5, WHEN MENSES BEGINS, DECREASE TO 1 A DAY 105 tablet 3   SUMAtriptan (IMITREX) 25 MG tablet Take 25 mg by mouth every 2 (two) hours as needed for migraine.     ibuprofen  (ADVIL ,MOTRIN ) 800 MG tablet Take 1 tablet (800 mg total) by mouth every 8 (eight)  hours as needed. (Patient not taking: Reported on 01/08/2024) 50 tablet 1   levothyroxine (SYNTHROID) 137 MCG tablet Take 137 mcg by mouth daily before breakfast.     LORazepam  (ATIVAN ) 0.5 MG tablet Take 0.5-1 tablets (0.25-0.5 mg total) by mouth every 8 (eight) hours as needed for anxiety. (Patient not taking: No sig reported) 30 tablet 1    Results for orders placed or performed during the hospital encounter of 01/14/24 (from the past 48 hours)  Pregnancy, urine POC     Status: None   Collection Time: 01/14/24 10:59 AM  Result Value Ref Range   Preg Test, Ur NEGATIVE NEGATIVE    Comment:        THE SENSITIVITY OF THIS METHODOLOGY IS >20 mIU/mL.    No results found.  Review of Systems  All other systems reviewed and are negative.   Blood pressure (!) 153/91, temperature 98.2 F (36.8 C), temperature source Oral, height 5' 6 (1.676 m), weight 91.2 kg, last menstrual period 12/14/2023, SpO2 97%. Physical Exam Constitutional:      Appearance: Normal appearance. She is normal weight.  HENT:     Head: Normocephalic and atraumatic.     Right Ear: External ear normal.     Left Ear: External ear normal.  Nose: Nose normal.     Mouth/Throat:     Mouth: Mucous membranes are moist.     Pharynx: Oropharynx is clear.  Eyes:     Extraocular Movements: Extraocular movements intact.     Pupils: Pupils are equal, round, and reactive to light.  Neck:     Comments: Enlarged thyroid  gland, more to the right. Cardiovascular:     Rate and Rhythm: Normal rate.  Pulmonary:     Effort: Pulmonary effort is normal.  Skin:    General: Skin is warm and dry.  Neurological:     General: No focal deficit present.     Mental Status: She is alert and oriented to person, place, and time.  Psychiatric:        Mood and Affect: Mood normal.        Behavior: Behavior normal.        Thought Content: Thought content normal.        Judgment: Judgment normal.      Assessment/Plan Multinodular  thyroid  goiter  To OR for total thyroidectomy.  Vaughan Ricker, MD 01/14/2024, 12:12 PM

## 2024-01-14 NOTE — Progress Notes (Signed)
 PCP - Charmaine Katina RIGGERS Cardiologist - denies Endocrinologist - Reyes Kerr,MD  PPM/ICD - denies Device Orders -  Rep Notified -   Chest x-ray -  EKG - na Stress Test - denies ECHO - denies Cardiac Cath - denies  Sleep Study - denies CPAP - no  Fasting Blood Sugar - na Checks Blood Sugar _____ times a day  Last dose of GLP1 agonist-  na GLP1 instructions: na  Blood Thinner Instructions:na Aspirin Instructions:na  ERAS Protcol - PRE-SURGERY Ensure or G2-   COVID TEST- na   Anesthesia review: no  Patient denies shortness of breath, fever, cough and chest pain at PAT appointment   All instructions explained to the patient, with a verbal understanding of the material. Patient agrees to go over the instructions while at home for a better understanding. The opportunity to ask questions was provided.

## 2024-01-14 NOTE — Transfer of Care (Signed)
 Immediate Anesthesia Transfer of Care Note  Patient: Kaitlyn Todd  Procedure(s) Performed: TOTAL THYROIDECTOMY (Bilateral)  Patient Location: PACU  Anesthesia Type:General  Level of Consciousness: drowsy and patient cooperative  Airway & Oxygen Therapy: Patient connected to face mask oxygen  Post-op Assessment: Report given to RN and Post -op Vital signs reviewed and stable  Post vital signs: Reviewed and stable  Last Vitals:  Vitals Value Taken Time  BP 140/90 01/14/24 15:53  Temp    Pulse 102 01/14/24 15:54  Resp 12   SpO2 98 % 01/14/24 15:54  Vitals shown include unfiled device data.  Last Pain:  Vitals:   01/14/24 1039  TempSrc:   PainSc: 0-No pain      Patients Stated Pain Goal: 3 (01/14/24 1039)  Complications: No notable events documented.

## 2024-01-15 DIAGNOSIS — E042 Nontoxic multinodular goiter: Secondary | ICD-10-CM | POA: Diagnosis not present

## 2024-01-15 LAB — CALCIUM
Calcium: 7.4 mg/dL — ABNORMAL LOW (ref 8.9–10.3)
Calcium: 7.4 mg/dL — ABNORMAL LOW (ref 8.9–10.3)

## 2024-01-15 MED ORDER — ORAL CARE MOUTH RINSE: 15.0000 mL | OROMUCOSAL | Status: DC | PRN

## 2024-01-15 NOTE — Discharge Summary (Signed)
 Physician Discharge Summary  Patient ID: Kaitlyn Todd MRN: [id number] DOB/AGE: 1981/02/16 43 y.o.  Admit date: 01/14/2024 Discharge date: 01/15/2024  Admission Diagnoses:  Multinodular goiter  Discharge Diagnoses:  Principal Problem:   Multinodular goiter   Discharged Condition: good  Hospital Course: 43 year old female presented for total thyroidectomy.  See operative note.  She was observed overnight with drain in place and did well.  The drain was removed on POD 1.  Calcium level was lower after surgery compared to before but remained stable over three checks.  She is felt stable for discharge on POD 1.  Consults: None  Significant Diagnostic Studies: None  Treatments: surgery: Total thyroidectomy  Discharge Exam: Blood pressure 125/78, pulse 79, temperature 98.2 F (36.8 C), temperature source Oral, resp. rate 16, height 5' 6 (1.676 m), weight 91.2 kg, last menstrual period 12/14/2023, SpO2 97%. General appearance: alert, cooperative, and no distress Neck: thyroid  incision clean and intact, no fluid collection, normal voice  Disposition: Discharge disposition: 01-Home or Self Care       Discharge Instructions     Diet - low sodium heart healthy   Complete by: As directed    Discharge instructions   Complete by: As directed    Normal diet.  Avoid strenuous activity.  OK to shower incision, gently pat dry.  Do not apply ointment to the incision.  Tylenol  and/or Motrin  for pain.  Watch for hematoma or symptoms of low calcium (muscle cramps and tingling).   Increase activity slowly   Complete by: As directed    No wound care   Complete by: As directed       Allergies as of 01/15/2024       Reactions   Latex Itching   Adhesive [tape] Swelling        Medication List     TAKE these medications    fluticasone 50 MCG/ACT nasal spray Commonly known as: FLONASE Place 2 sprays into both nostrils daily as needed for allergies.   ibuprofen  800 MG  tablet Commonly known as: ADVIL  Take 1 tablet (800 mg total) by mouth every 8 (eight) hours as needed.   levothyroxine 137 MCG tablet Commonly known as: SYNTHROID Take 137 mcg by mouth daily before breakfast.   LORazepam  0.5 MG tablet Commonly known as: Ativan  Take 0.5-1 tablets (0.25-0.5 mg total) by mouth every 8 (eight) hours as needed for anxiety.   ondansetron  4 MG disintegrating tablet Commonly known as: ZOFRAN -ODT Take 4 mg by mouth every 8 (eight) hours as needed for vomiting or nausea.   sertraline  50 MG tablet Commonly known as: ZOLOFT  1 TABLET EVERY DAY, 5 DAYS PRIOR TO MENSES, INCREASE TO 1.5, WHEN MENSES BEGINS, DECREASE TO 1 A DAY   SUMAtriptan 25 MG tablet Commonly known as: IMITREX Take 25 mg by mouth every 2 (two) hours as needed for migraine.        Follow-up Information     Carlie Clark, MD. Schedule an appointment as soon as possible for a visit in 1 week(s).   Specialty: Otolaryngology Contact information: [address] [place] [address] [phone]                 Signed: Clark Carlie 01/15/2024, 3:45 PM

## 2024-01-15 NOTE — TOC Initial Note (Signed)
 Transition of Care [facility]) - Initial/Assessment Note    Patient Details  Name: Kaitlyn Todd MRN: [id number] Date of Birth: 1981-02-14  Transition of Care [facility] CM/SW Contact:    Kaitlyn Todd Kaitlyn Todd Phone Number: 01/15/2024, 8:50 AM  Clinical Narrative:                 Pt admitted from home with spouse for total thyroidectomy. No current TOC needs, please consult as needs arise.         Patient Goals and CMS Choice            Expected Discharge Plan and Services                                              Prior Living Arrangements/Services                       Activities of Daily Living   ADL Screening (condition at time of admission) Independently performs ADLs?: Yes (appropriate for developmental age) Is the patient deaf or have difficulty hearing?: Yes Does the patient have difficulty seeing, even when wearing glasses/contacts?: No Does the patient have difficulty concentrating, remembering, or making decisions?: No  Permission Sought/Granted                  Emotional Assessment              Admission diagnosis:  Multinodular goiter [E04.2] Patient Active Problem List   Diagnosis Date Noted   Multinodular goiter 01/14/2024   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 AT [address] OF [facility] [place] [address] Phone: [phone] Fax: [phone]  [facility] Delivery - [place] - [address] [place] [address] Phone: [phone] Fax: [phone]  [facility] [place], [place] - [address] [place] [address] Phone: [phone] Fax: [phone]     Social Drivers of Health (SDOH) Social History: SDOH Screenings   Food Insecurity: No Food Insecurity (01/14/2024)  Housing: Low Risk  (01/14/2024)  Transportation Needs: No Transportation Needs (01/14/2024)  Utilities: Not At Risk (01/14/2024)  Social Connections: Unknown (07/19/2021)   [facility]  Tobacco Use: Low Risk  (01/14/2024)   SDOH Interventions:     Readmission Risk Interventions     No data to display

## 2024-01-15 NOTE — Plan of Care (Signed)

## 2024-01-15 NOTE — Progress Notes (Signed)
 Subjective: Feels tired but not having much pain.  Feels a lump feeling in the throat when she swallows.  No other complaints.  Objective: Vital signs in last 24 hours: Temp:  [97.4 F (36.3 C)-99.5 F (37.5 C)] 98.2 F (36.8 C) (10/31 1225) Pulse Rate:  [73-102] 79 (10/31 1225) Resp:  [16-21] 16 (10/31 1225) BP: (115-142)/(73-90) 125/78 (10/31 1225) SpO2:  [93 %-97 %] 97 % (10/31 1225) Wt Readings from Last 1 Encounters:  01/14/24 91.2 kg    Intake/Output from previous day: 10/30 0701 - 10/31 0700 In: 1500 [I.V.:1300; IV Piggyback:200] Out: 75 [Drains:45; Blood:30] Intake/Output this shift: Total I/O In: 579 [I.V.:579] Out: -   General appearance: alert, cooperative, and no distress Neck: thyroidectomy incision clean and intact, no significant fluid collection, drain removed.  Normal voice.  Recent Labs    01/12/24 1430  WBC 10.0  HGB 14.6  HCT 43.2  PLT 313    Recent Labs    01/12/24 1430 01/14/24 2124 01/15/24 0426  NA 136  --   --   K 3.6  --   --   CL 104  --   --   CO2 23  --   --   GLUCOSE 100*  --   --   BUN 9  --   --   CREATININE 0.98  --   --   CALCIUM 9.0 7.6* 7.4*    Medications: I have reviewed the patient's current medications.  Assessment/Plan: S/p total thyroidectomy  Doing well.  Drain removed.  Throat sensation likely related to endotracheal tube and should resolve with time.  Normal voice.  Calcium level decreased after surgery.  We agreed to wait for another check this afternoon before deciding on discharge.   LOS: 0 days   Vaughan Ricker 01/15/2024, 1:13 PM

## 2024-01-16 ENCOUNTER — Encounter (HOSPITAL_COMMUNITY): Payer: Self-pay | Admitting: Otolaryngology

## 2024-01-18 LAB — SURGICAL PATHOLOGY

## 2024-01-26 ENCOUNTER — Ambulatory Visit
Admission: RE | Admit: 2024-01-26 | Discharge: 2024-01-26 | Disposition: A | Source: Ambulatory Visit | Attending: Obstetrics and Gynecology | Admitting: Obstetrics and Gynecology

## 2024-01-26 DIAGNOSIS — Z1231 Encounter for screening mammogram for malignant neoplasm of breast: Secondary | ICD-10-CM

## 2024-03-07 ENCOUNTER — Other Ambulatory Visit: Payer: Self-pay | Admitting: Physician Assistant

## 2024-03-29 ENCOUNTER — Ambulatory Visit: Payer: Commercial Managed Care - PPO | Admitting: Physician Assistant

## 2024-09-19 ENCOUNTER — Ambulatory Visit: Admitting: Physician Assistant
